# Patient Record
Sex: Female | Born: 1980
Health system: Southern US, Community
[De-identification: ages and names within clinical notes are randomized; demographics above are authoritative.]

## PROBLEM LIST (undated history)

## (undated) DIAGNOSIS — Z789 Other specified health status: Secondary | ICD-10-CM

## (undated) DIAGNOSIS — Z889 Allergy status to unspecified drugs, medicaments and biological substances status: Secondary | ICD-10-CM

## (undated) DIAGNOSIS — J189 Pneumonia, unspecified organism: Secondary | ICD-10-CM

## (undated) DIAGNOSIS — M5126 Other intervertebral disc displacement, lumbar region: Secondary | ICD-10-CM

## (undated) DIAGNOSIS — L509 Urticaria, unspecified: Secondary | ICD-10-CM

## (undated) DIAGNOSIS — F419 Anxiety disorder, unspecified: Secondary | ICD-10-CM

## (undated) DIAGNOSIS — G4730 Sleep apnea, unspecified: Secondary | ICD-10-CM

## (undated) DIAGNOSIS — Z973 Presence of spectacles and contact lenses: Secondary | ICD-10-CM

## (undated) HISTORY — PX: ABDOMINAL HYSTERECTOMY: SHX81

## (undated) HISTORY — DX: Urticaria, unspecified: L50.9

## (undated) HISTORY — PX: WISDOM TOOTH EXTRACTION: SHX21

## (undated) HISTORY — PX: BACK SURGERY: SHX140

## (undated) HISTORY — PX: TYMPANOSTOMY TUBE PLACEMENT: SHX32

---
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
## 2002-01-29 ENCOUNTER — Encounter: Admission: RE | Admit: 2002-01-29 | Discharge: 2002-01-29 | Payer: Self-pay | Admitting: *Deleted

## 2002-02-03 ENCOUNTER — Encounter: Admission: RE | Admit: 2002-02-03 | Discharge: 2002-02-03 | Payer: Self-pay | Admitting: Psychiatry

## 2002-02-23 ENCOUNTER — Encounter: Admission: RE | Admit: 2002-02-23 | Discharge: 2002-02-23 | Payer: Self-pay | Admitting: Psychiatry

## 2002-03-05 ENCOUNTER — Other Ambulatory Visit: Admission: RE | Admit: 2002-03-05 | Discharge: 2002-03-05 | Payer: Self-pay | Admitting: Obstetrics and Gynecology

## 2002-03-09 ENCOUNTER — Encounter: Admission: RE | Admit: 2002-03-09 | Discharge: 2002-03-09 | Payer: Self-pay | Admitting: Psychiatry

## 2002-03-23 ENCOUNTER — Encounter: Admission: RE | Admit: 2002-03-23 | Discharge: 2002-03-23 | Payer: Self-pay | Admitting: Psychiatry

## 2002-04-30 ENCOUNTER — Ambulatory Visit (HOSPITAL_COMMUNITY): Admission: RE | Admit: 2002-04-30 | Discharge: 2002-04-30 | Payer: Self-pay | Admitting: Obstetrics and Gynecology

## 2002-05-21 HISTORY — PX: LEEP: SHX91

## 2002-10-02 ENCOUNTER — Ambulatory Visit (HOSPITAL_COMMUNITY): Admission: RE | Admit: 2002-10-02 | Discharge: 2002-10-02 | Payer: Self-pay | Admitting: Obstetrics and Gynecology

## 2003-02-11 ENCOUNTER — Other Ambulatory Visit: Admission: RE | Admit: 2003-02-11 | Discharge: 2003-02-11 | Payer: Self-pay | Admitting: Obstetrics and Gynecology

## 2003-06-30 ENCOUNTER — Other Ambulatory Visit: Admission: RE | Admit: 2003-06-30 | Discharge: 2003-06-30 | Payer: Self-pay | Admitting: Obstetrics and Gynecology

## 2003-07-26 ENCOUNTER — Ambulatory Visit (HOSPITAL_COMMUNITY): Admission: RE | Admit: 2003-07-26 | Discharge: 2003-07-26 | Payer: Self-pay | Admitting: *Deleted

## 2010-05-21 NOTE — L&D Delivery Note (Signed)
Delivery Note At 10:41 PM a viable and healthy female was delivered via Vaginal, Spontaneous Delivery (Presentation:ROA ;  ).  APGAR: 9, 9; weight: Not yet available Placenta status: Intact, Spontaneous.  Cord: 3 vessels.  Pt delivered a vigorous female infant in the vertex OA presentation.  With delivery of the head a nuchal cord x 1 was noted.  The cord could not be reduced before the anterior shoulder delivered.  The cord was also noted to be wrapped around the shoulders and left arm.  The infant cried vigorously after delivery and was passed to the waiting maternal abdomen.  Cord was clamped and cut.  Cord blood was collected.  The placenta delivered spontaneously, intact, with 3VC.  A small left labial laceration was repaired with a 4-0 vicryl figure of 8 stitch.  EBL 250.  Mother and baby are doing well after delivery.  Anesthesia: Epidural  Episiotomy: None  Lacerations: Left labial laceration Suture Repair: vicryl 4-0 Est. Blood Loss (mL): 250 cc  Mom to postpartum.  Baby to nursery-stable.  Sherilee Smotherman H. 04/05/2011, 11:04 PM

## 2010-10-05 LAB — ANTIBODY SCREEN: Antibody Screen: NEGATIVE

## 2011-03-05 LAB — HIV ANTIBODY (ROUTINE TESTING W REFLEX): HIV: NONREACTIVE

## 2011-03-05 LAB — STREP B DNA PROBE: GBS: NEGATIVE

## 2011-04-05 ENCOUNTER — Encounter (HOSPITAL_COMMUNITY): Payer: Self-pay | Admitting: Anesthesiology

## 2011-04-05 ENCOUNTER — Inpatient Hospital Stay (HOSPITAL_COMMUNITY): Payer: BC Managed Care – PPO | Admitting: Anesthesiology

## 2011-04-05 ENCOUNTER — Inpatient Hospital Stay (HOSPITAL_COMMUNITY)
Admission: AD | Admit: 2011-04-05 | Discharge: 2011-04-07 | DRG: 373 | Disposition: A | Discharge status: Home / Self Care | Payer: BC Managed Care – PPO | Source: Ambulatory Visit | Attending: Obstetrics and Gynecology | Admitting: Obstetrics and Gynecology

## 2011-04-05 ENCOUNTER — Encounter (HOSPITAL_COMMUNITY): Payer: Self-pay | Admitting: *Deleted

## 2011-04-05 DIAGNOSIS — Z789 Other specified health status: Secondary | ICD-10-CM | POA: Insufficient documentation

## 2011-04-05 HISTORY — DX: Other specified health status: Z78.9

## 2011-04-05 LAB — URIC ACID: Uric Acid, Serum: 4 mg/dL (ref 2.4–7.0)

## 2011-04-05 LAB — COMPREHENSIVE METABOLIC PANEL
ALT: 8 U/L (ref 0–35)
Calcium: 9.3 mg/dL (ref 8.4–10.5)
Creatinine, Ser: 0.58 mg/dL (ref 0.50–1.10)
GFR calc Af Amer: >90 mL/min (ref >90)
Glucose, Bld: 79 mg/dL (ref 70–99)
Sodium: 137 mEq/L (ref 135–145)
Total Protein: 6 g/dL (ref 6.0–8.3)

## 2011-04-05 LAB — CBC
HCT: 36.7 % (ref 36.0–46.0)
MCHC: 33 g/dL (ref 30.0–36.0)
RDW: 13.8 % (ref 11.5–15.5)

## 2011-04-05 LAB — RPR: RPR Ser Ql: NONREACTIVE

## 2011-04-05 MED ORDER — TERBUTALINE SULFATE 1 MG/ML IJ SOLN: 0.2500 mg | Freq: Once | INTRAMUSCULAR | Status: AC | PRN

## 2011-04-05 MED ORDER — LACTATED RINGERS IV SOLN
500.0000 mL | INTRAVENOUS | Status: DC | PRN
  Administered 2011-04-05: 1000 mL via INTRAVENOUS

## 2011-04-05 MED ORDER — OXYTOCIN 20 UNITS IN LACTATED RINGERS INFUSION - SIMPLE
1.0000 m[IU]/min | INTRAVENOUS | Status: DC
  Administered 2011-04-05: 2 m[IU]/min via INTRAVENOUS
  Filled 2011-04-05: qty 1000

## 2011-04-05 MED ORDER — LACTATED RINGERS IV SOLN: 500.0000 mL | Freq: Once | INTRAVENOUS | Status: DC

## 2011-04-05 MED ORDER — BUTORPHANOL TARTRATE 2 MG/ML IJ SOLN: 1.0000 mg | INTRAMUSCULAR | Status: DC | PRN

## 2011-04-05 MED ORDER — FLEET ENEMA 7-19 GM/118ML RE ENEM: 1.0000 | ENEMA | RECTAL | Status: DC | PRN

## 2011-04-05 MED ORDER — CITRIC ACID-SODIUM CITRATE 334-500 MG/5ML PO SOLN: 30.0000 mL | ORAL | Status: DC | PRN

## 2011-04-05 MED ORDER — OXYCODONE-ACETAMINOPHEN 5-325 MG PO TABS: 2.0000 | ORAL_TABLET | ORAL | Status: DC | PRN

## 2011-04-05 MED ORDER — OXYTOCIN BOLUS FROM INFUSION
500.0000 mL | Freq: Once | INTRAVENOUS | Status: DC
  Filled 2011-04-05: qty 1000
  Filled 2011-04-05: qty 500

## 2011-04-05 MED ORDER — LACTATED RINGERS IV SOLN
INTRAVENOUS | Status: DC
  Administered 2011-04-05: 70 mL/h via INTRAVENOUS
  Administered 2011-04-05: 125 mL/h via INTRAVENOUS

## 2011-04-05 MED ORDER — SODIUM BICARBONATE 8.4 % IV SOLN
INTRAVENOUS | Status: DC | PRN
  Administered 2011-04-05: 5 mL via EPIDURAL

## 2011-04-05 MED ORDER — PHENYLEPHRINE 40 MCG/ML (10ML) SYRINGE FOR IV PUSH (FOR BLOOD PRESSURE SUPPORT)
80.0000 ug | PREFILLED_SYRINGE | INTRAVENOUS | Status: DC | PRN
  Filled 2011-04-05: qty 5

## 2011-04-05 MED ORDER — FENTANYL 2.5 MCG/ML BUPIVACAINE 1/10 % EPIDURAL INFUSION (WH - ANES)
INTRAMUSCULAR | Status: DC | PRN
  Administered 2011-04-05: 14 mL/h via EPIDURAL

## 2011-04-05 MED ORDER — LIDOCAINE HCL (PF) 1 % IJ SOLN
30.0000 mL | INTRAMUSCULAR | Status: DC | PRN
  Filled 2011-04-05: qty 30

## 2011-04-05 MED ORDER — EPHEDRINE 5 MG/ML INJ
10.0000 mg | INTRAVENOUS | Status: DC | PRN
  Filled 2011-04-05: qty 4

## 2011-04-05 MED ORDER — EPHEDRINE 5 MG/ML INJ: 10.0000 mg | INTRAVENOUS | Status: DC | PRN

## 2011-04-05 MED ORDER — ONDANSETRON HCL 4 MG/2ML IJ SOLN: 4.0000 mg | Freq: Four times a day (QID) | INTRAMUSCULAR | Status: DC | PRN

## 2011-04-05 MED ORDER — ACETAMINOPHEN 325 MG PO TABS: 650.0000 mg | ORAL_TABLET | ORAL | Status: DC | PRN

## 2011-04-05 MED ORDER — DIPHENHYDRAMINE HCL 50 MG/ML IJ SOLN
12.5000 mg | INTRAMUSCULAR | Status: AC | PRN
  Administered 2011-04-05 (×3): 12.5 mg via INTRAVENOUS
  Filled 2011-04-05 (×2): qty 1

## 2011-04-05 MED ORDER — PHENYLEPHRINE 40 MCG/ML (10ML) SYRINGE FOR IV PUSH (FOR BLOOD PRESSURE SUPPORT): 80.0000 ug | PREFILLED_SYRINGE | INTRAVENOUS | Status: DC | PRN

## 2011-04-05 MED ORDER — ZOLPIDEM TARTRATE 10 MG PO TABS: 10.0000 mg | ORAL_TABLET | Freq: Every evening | ORAL | Status: DC | PRN

## 2011-04-05 MED ORDER — FENTANYL 2.5 MCG/ML BUPIVACAINE 1/10 % EPIDURAL INFUSION (WH - ANES)
14.0000 mL/h | INTRAMUSCULAR | Status: DC
  Administered 2011-04-05 (×2): 14 mL/h via EPIDURAL
  Filled 2011-04-05 (×3): qty 60

## 2011-04-05 MED ORDER — IBUPROFEN 600 MG PO TABS: 600.0000 mg | ORAL_TABLET | Freq: Four times a day (QID) | ORAL | Status: DC | PRN

## 2011-04-05 MED ORDER — OXYTOCIN 20 UNITS IN LACTATED RINGERS INFUSION - SIMPLE
125.0000 mL/h | Freq: Once | INTRAVENOUS | Status: AC
  Administered 2011-04-05: 999 mL/h via INTRAVENOUS

## 2011-04-05 NOTE — Progress Notes (Signed)
  Labor Progress Note  S: Feeling uncomfortable with contractions.  Requesting epidural before AROM O: Filed Vitals:   04/05/11 1102 04/05/11 1132 04/05/11 1200 04/05/11 1232  BP: 116/67 116/50  123/69  Pulse: 83 63  68  Temp:   98 F (36.7 C)   TempSrc:   Oral   Resp: 18 18 20    Height:      Weight:       AOX3, uncomfortable appearing FHT 150 + accels, no decels, reactive Cervix deferred until after epidural placement toco q 3-5 irreg  A/P 1) FWB reassuring 2) Place epidural 3) AROM after epidural

## 2011-04-05 NOTE — Anesthesia Procedure Notes (Signed)

## 2011-04-05 NOTE — Progress Notes (Signed)
MD made aware of pt status: SVE, uterine contraction pattern, FHT. Will continue with current POC. Will continue to monitor.

## 2011-04-05 NOTE — H&P (Signed)
Norma Carlson is a 30 y.o. female [phone] @ 39+0 presenting for IOL Uncomplicated pregnancy to this point.  Pt transferred OB care at 13 weeks. She denies VB, LOF. Endorses active FM.  History OB History    Grav Para Term Preterm Abortions TAB SAB Ect Mult Living   4 1 1  2  2   1      Past Medical History  Diagnosis Date  . No pertinent past medical history    Past Surgical History  Procedure Date  . Leep 2004   Family History: Non-contributory Social History:  reports that she has never smoked. She does not have any smokeless tobacco history on file. She reports that she does not drink alcohol or use illicit drugs.  ROS  Dilation: 2 Effacement (%): 70 Station: -2 Exam by:: felkel,rn Blood pressure 133/67, pulse 76, temperature 98.5 F (36.9 C), temperature source Oral, resp. rate 18, height 5\' 8"  (1.727 m), weight 97.977 kg (216 lb). Exam Physical Exam  Prenatal labs: ABO, Rh: O/Positive/-- (05/17 0000) Antibody: Negative (05/17 0000) Rubella: Immune (05/15 0000) RPR: Nonreactive (10/15 0000)  HBsAg:   Neg HIV: Non-reactive (10/15 0000)  GBS: Negative (10/15 0000)   Assessment/Plan: 30 yo G4P1021 at 39+0 for IOL 1) Admit 2) Pitocin 3) AROM when able 4) Epidural on request  Norma Carlson H. 04/05/2011, 9:47 AM

## 2011-04-05 NOTE — Progress Notes (Signed)
  Labor PN  Comfortable after epidural .AFVSS Dilation: 3 Effacement (%): 70 Cervical Position: Anterior Station: -2 Presentation: Vertex Exam by:: dr. Tenny Craw fht 130-150 reactive with accels no decels toco q 2-4  AROM clear fluid  A/p Cont pit fwb reassuring

## 2011-04-05 NOTE — Progress Notes (Signed)
  Labor Progress Note  S: Comfortable with epidural O:  Filed Vitals:   04/05/11 1602 04/05/11 1632 04/05/11 1702 04/05/11 1713  BP: 127/66 128/59 130/57   Pulse: 71 76 77   Temp: 98.1 F (36.7 C)     TempSrc: Oral     Resp: 18 18 18 20   Height:      Weight:      AOx3 Cvx 4/780/-2 Toco: irregularly tracing FHT 130-150 reactive + accels, no decels  IUPC placed  A/P 1) Continue Pit 2) FWB reassuring

## 2011-04-05 NOTE — Progress Notes (Signed)
Pt delivered viable female SVD by DR Tenny Craw with APGARS 9,9.

## 2011-04-05 NOTE — Anesthesia Preprocedure Evaluation (Signed)

## 2011-04-06 MED ORDER — IBUPROFEN 600 MG PO TABS
600.0000 mg | ORAL_TABLET | Freq: Four times a day (QID) | ORAL | Status: DC
  Administered 2011-04-06 – 2011-04-07 (×7): 600 mg via ORAL
  Filled 2011-04-06 (×7): qty 1

## 2011-04-06 MED ORDER — SENNOSIDES-DOCUSATE SODIUM 8.6-50 MG PO TABS
2.0000 | ORAL_TABLET | Freq: Every day | ORAL | Status: DC
  Administered 2011-04-06 – 2011-04-07 (×2): 2 via ORAL

## 2011-04-06 MED ORDER — TETANUS-DIPHTH-ACELL PERTUSSIS 5-2.5-18.5 LF-MCG/0.5 IM SUSP
0.5000 mL | Freq: Once | INTRAMUSCULAR | Status: AC
  Administered 2011-04-07: 0.5 mL via INTRAMUSCULAR
  Filled 2011-04-06: qty 0.5

## 2011-04-06 MED ORDER — METHYLERGONOVINE MALEATE 0.2 MG/ML IJ SOLN: 0.2000 mg | INTRAMUSCULAR | Status: DC | PRN

## 2011-04-06 MED ORDER — DIPHENHYDRAMINE HCL 25 MG PO CAPS: 25.0000 mg | ORAL_CAPSULE | Freq: Four times a day (QID) | ORAL | Status: DC | PRN

## 2011-04-06 MED ORDER — METHYLERGONOVINE MALEATE 0.2 MG PO TABS: 0.2000 mg | ORAL_TABLET | ORAL | Status: DC | PRN

## 2011-04-06 MED ORDER — OXYCODONE-ACETAMINOPHEN 5-325 MG PO TABS
1.0000 | ORAL_TABLET | ORAL | Status: DC | PRN
  Administered 2011-04-06: 2 via ORAL
  Administered 2011-04-06 (×2): 1 via ORAL
  Administered 2011-04-06: 2 via ORAL
  Administered 2011-04-06 – 2011-04-07 (×2): 1 via ORAL
  Administered 2011-04-07: 2 via ORAL
  Filled 2011-04-06 (×2): qty 1
  Filled 2011-04-06 (×3): qty 2
  Filled 2011-04-06: qty 1
  Filled 2011-04-06: qty 2

## 2011-04-06 MED ORDER — PRENATAL PLUS 27-1 MG PO TABS
1.0000 | ORAL_TABLET | Freq: Every day | ORAL | Status: DC
  Administered 2011-04-06 – 2011-04-07 (×2): 1 via ORAL
  Filled 2011-04-06 (×2): qty 1

## 2011-04-06 MED ORDER — SIMETHICONE 80 MG PO CHEW: 80.0000 mg | CHEWABLE_TABLET | ORAL | Status: DC | PRN

## 2011-04-06 MED ORDER — DIBUCAINE 1 % RE OINT: 1.0000 "application " | TOPICAL_OINTMENT | RECTAL | Status: DC | PRN

## 2011-04-06 MED ORDER — ONDANSETRON HCL 4 MG PO TABS: 4.0000 mg | ORAL_TABLET | ORAL | Status: DC | PRN

## 2011-04-06 MED ORDER — ONDANSETRON HCL 4 MG/2ML IJ SOLN: 4.0000 mg | INTRAMUSCULAR | Status: DC | PRN

## 2011-04-06 MED ORDER — ZOLPIDEM TARTRATE 5 MG PO TABS: 5.0000 mg | ORAL_TABLET | Freq: Every evening | ORAL | Status: DC | PRN

## 2011-04-06 MED ORDER — LANOLIN HYDROUS EX OINT: TOPICAL_OINTMENT | CUTANEOUS | Status: DC | PRN

## 2011-04-06 MED ORDER — WITCH HAZEL-GLYCERIN EX PADS: 1.0000 "application " | MEDICATED_PAD | CUTANEOUS | Status: DC | PRN

## 2011-04-06 MED ORDER — BENZOCAINE-MENTHOL 20-0.5 % EX AERO: 1.0000 "application " | INHALATION_SPRAY | CUTANEOUS | Status: DC | PRN

## 2011-04-06 NOTE — Progress Notes (Signed)
Post Partum Day 1 Subjective: no complaints  Objective: Blood pressure 113/73, pulse 76, temperature 97.6 F (36.4 C), temperature source Oral, resp. rate 20, height 5\' 8"  (1.727 m), weight 97.977 kg (216 lb), SpO2 97.00%, unknown if currently breastfeeding.  Physical Exam:  General: alert Lochia: appropriate Uterine Fundus: firm   Basename 04/05/11 0805  HGB 12.1  HCT 36.7    Assessment/Plan: Plan for discharge tomorrow, infant circumcision at parent's request.   LOS: 1 day   Abhimanyu Cruces D 04/06/2011, 8:44 AM

## 2011-04-06 NOTE — Anesthesia Postprocedure Evaluation (Signed)
  Anesthesia Post-op Note  Patient: Norma Carlson  Procedure(s) Performed: * No procedures listed *  Patient Location: Mother/Baby  Anesthesia Type: Epidural  Level of Consciousness: awake, alert  and oriented  Airway and Oxygen Therapy: Patient Spontanous Breathing  Post-op Assessment: Patient's Cardiovascular Status Stable and Respiratory Function Stable  Post-op Vital Signs: Reviewed and stable  Complications: No apparent anesthesia complications

## 2011-04-06 NOTE — Progress Notes (Signed)
Pt to room 112 in stable condition. Report given to RN

## 2011-04-07 MED ORDER — INFLUENZA VIRUS VACC SPLIT PF IM SUSP
0.5000 mL | INTRAMUSCULAR | Status: AC | PRN
  Administered 2011-04-07: 0.5 mL via INTRAMUSCULAR
  Filled 2011-04-07: qty 0.5

## 2011-04-07 NOTE — Discharge Summary (Signed)
Obstetric Discharge Summary Reason for Admission: induction of labor Prenatal Procedures: ultrasound Intrapartum Procedures: spontaneous vaginal delivery Postpartum Procedures: none Complications-Operative and Postpartum: small left labial laceration - repaired Hemoglobin  Date Value Range Status  04/05/2011 12.1  12.0-15.0 (g/dL) Final     HCT  Date Value Range Status  04/05/2011 36.7  36.0-46.0 (%) Final    Discharge Diagnoses: Term Pregnancy-delivered  Discharge Information: Date: 04/07/2011 Activity: pelvic rest Diet: routine Medications: PNV and Ibuprofen Condition: stable Instructions: refer to practice specific booklet Discharge to: home Follow-up Information    Follow up with ROSS,KENDRA H. in 5 weeks.   Contact information:   [address] [place] [address] [phone]          Newborn Data: Live born female  Birth Weight: 7 lb 12 oz (3515 g) APGAR: 9, 9  Home with mother.  Emaad Nanna D 04/07/2011, 8:50 AM

## 2011-04-07 NOTE — Progress Notes (Signed)
Post Partum Day 2 Subjective: no complaints  Objective: Blood pressure 119/68, pulse 106, temperature 98.2 F (36.8 C), temperature source Oral, resp. rate 18, height 5\' 8"  (1.727 m), weight 97.977 kg (216 lb), SpO2 97.00%.  Not breast feeding.  Physical Exam:  General: alert Lochia: appropriate Uterine Fundus: firm   Basename 04/05/11 0805  HGB 12.1  HCT 36.7    Assessment/Plan: Discharge home   LOS: 2 days   Norma Carlson D 04/07/2011, 8:46 AM

## 2011-04-12 ENCOUNTER — Inpatient Hospital Stay (HOSPITAL_COMMUNITY): Admission: AD | Admit: 2011-04-12 | Payer: Self-pay | Source: Ambulatory Visit | Admitting: Obstetrics & Gynecology

## 2014-03-22 ENCOUNTER — Encounter (HOSPITAL_COMMUNITY): Payer: Self-pay | Admitting: *Deleted

## 2015-12-23 DIAGNOSIS — J0101 Acute recurrent maxillary sinusitis: Secondary | ICD-10-CM | POA: Diagnosis not present

## 2015-12-23 DIAGNOSIS — Z6834 Body mass index (BMI) 34.0-34.9, adult: Secondary | ICD-10-CM | POA: Diagnosis not present

## 2015-12-23 DIAGNOSIS — H6501 Acute serous otitis media, right ear: Secondary | ICD-10-CM | POA: Diagnosis not present

## 2016-05-18 DIAGNOSIS — Z6835 Body mass index (BMI) 35.0-35.9, adult: Secondary | ICD-10-CM | POA: Diagnosis not present

## 2016-05-18 DIAGNOSIS — J209 Acute bronchitis, unspecified: Secondary | ICD-10-CM | POA: Diagnosis not present

## 2016-05-18 DIAGNOSIS — J019 Acute sinusitis, unspecified: Secondary | ICD-10-CM | POA: Diagnosis not present

## 2016-07-04 ENCOUNTER — Other Ambulatory Visit: Payer: Self-pay | Admitting: Obstetrics and Gynecology

## 2016-07-04 DIAGNOSIS — H20022 Recurrent acute iridocyclitis, left eye: Secondary | ICD-10-CM | POA: Diagnosis not present

## 2016-07-04 DIAGNOSIS — Z124 Encounter for screening for malignant neoplasm of cervix: Secondary | ICD-10-CM | POA: Diagnosis not present

## 2016-07-04 DIAGNOSIS — Z01419 Encounter for gynecological examination (general) (routine) without abnormal findings: Secondary | ICD-10-CM | POA: Diagnosis not present

## 2016-07-04 DIAGNOSIS — Z6834 Body mass index (BMI) 34.0-34.9, adult: Secondary | ICD-10-CM | POA: Diagnosis not present

## 2016-07-05 LAB — CYTOLOGY - PAP

## 2016-08-28 DIAGNOSIS — Z30433 Encounter for removal and reinsertion of intrauterine contraceptive device: Secondary | ICD-10-CM | POA: Diagnosis not present

## 2016-08-28 DIAGNOSIS — Z6834 Body mass index (BMI) 34.0-34.9, adult: Secondary | ICD-10-CM | POA: Diagnosis not present

## 2016-10-09 DIAGNOSIS — Z30431 Encounter for routine checking of intrauterine contraceptive device: Secondary | ICD-10-CM | POA: Diagnosis not present

## 2016-10-09 DIAGNOSIS — Z6834 Body mass index (BMI) 34.0-34.9, adult: Secondary | ICD-10-CM | POA: Diagnosis not present

## 2016-11-12 DIAGNOSIS — Z6834 Body mass index (BMI) 34.0-34.9, adult: Secondary | ICD-10-CM | POA: Diagnosis not present

## 2016-11-12 DIAGNOSIS — J019 Acute sinusitis, unspecified: Secondary | ICD-10-CM | POA: Diagnosis not present

## 2016-12-24 DIAGNOSIS — H6502 Acute serous otitis media, left ear: Secondary | ICD-10-CM | POA: Diagnosis not present

## 2016-12-24 DIAGNOSIS — Z6835 Body mass index (BMI) 35.0-35.9, adult: Secondary | ICD-10-CM | POA: Diagnosis not present

## 2017-01-01 DIAGNOSIS — J019 Acute sinusitis, unspecified: Secondary | ICD-10-CM | POA: Diagnosis not present

## 2017-01-01 DIAGNOSIS — F419 Anxiety disorder, unspecified: Secondary | ICD-10-CM | POA: Diagnosis not present

## 2017-01-01 DIAGNOSIS — Z6835 Body mass index (BMI) 35.0-35.9, adult: Secondary | ICD-10-CM | POA: Diagnosis not present

## 2017-01-31 DIAGNOSIS — J019 Acute sinusitis, unspecified: Secondary | ICD-10-CM | POA: Diagnosis not present

## 2017-01-31 DIAGNOSIS — J209 Acute bronchitis, unspecified: Secondary | ICD-10-CM | POA: Diagnosis not present

## 2017-01-31 DIAGNOSIS — Z6835 Body mass index (BMI) 35.0-35.9, adult: Secondary | ICD-10-CM | POA: Diagnosis not present

## 2017-04-04 DIAGNOSIS — Z23 Encounter for immunization: Secondary | ICD-10-CM | POA: Diagnosis not present

## 2017-05-06 DIAGNOSIS — J209 Acute bronchitis, unspecified: Secondary | ICD-10-CM | POA: Diagnosis not present

## 2017-05-06 DIAGNOSIS — Z6836 Body mass index (BMI) 36.0-36.9, adult: Secondary | ICD-10-CM | POA: Diagnosis not present

## 2017-09-03 DIAGNOSIS — F419 Anxiety disorder, unspecified: Secondary | ICD-10-CM | POA: Diagnosis not present

## 2017-09-03 DIAGNOSIS — Z6836 Body mass index (BMI) 36.0-36.9, adult: Secondary | ICD-10-CM | POA: Diagnosis not present

## 2017-09-03 DIAGNOSIS — L259 Unspecified contact dermatitis, unspecified cause: Secondary | ICD-10-CM | POA: Diagnosis not present

## 2018-02-13 DIAGNOSIS — Z6841 Body Mass Index (BMI) 40.0 and over, adult: Secondary | ICD-10-CM | POA: Diagnosis not present

## 2018-02-13 DIAGNOSIS — F419 Anxiety disorder, unspecified: Secondary | ICD-10-CM | POA: Diagnosis not present

## 2018-03-06 DIAGNOSIS — Z23 Encounter for immunization: Secondary | ICD-10-CM | POA: Diagnosis not present

## 2018-03-14 DIAGNOSIS — J019 Acute sinusitis, unspecified: Secondary | ICD-10-CM | POA: Diagnosis not present

## 2018-03-14 DIAGNOSIS — Z6835 Body mass index (BMI) 35.0-35.9, adult: Secondary | ICD-10-CM | POA: Diagnosis not present

## 2018-03-14 DIAGNOSIS — B070 Plantar wart: Secondary | ICD-10-CM | POA: Diagnosis not present

## 2018-04-11 DIAGNOSIS — Z30431 Encounter for routine checking of intrauterine contraceptive device: Secondary | ICD-10-CM | POA: Diagnosis not present

## 2018-04-14 DIAGNOSIS — G4733 Obstructive sleep apnea (adult) (pediatric): Secondary | ICD-10-CM | POA: Diagnosis not present

## 2018-04-15 DIAGNOSIS — G4733 Obstructive sleep apnea (adult) (pediatric): Secondary | ICD-10-CM | POA: Diagnosis not present

## 2018-04-15 DIAGNOSIS — R5383 Other fatigue: Secondary | ICD-10-CM | POA: Diagnosis not present

## 2018-04-21 ENCOUNTER — Telehealth: Payer: Self-pay | Admitting: Obstetrics and Gynecology

## 2018-04-21 NOTE — Telephone Encounter (Signed)
Called and left a message for patient to call back to schedule a new patient doctor referral appointment with our office to see Dr. Edward JollySilva for: rectocele.

## 2018-04-22 NOTE — Telephone Encounter (Signed)
[facility] left voicemail after hours, [facility]

## 2018-04-23 DIAGNOSIS — M79672 Pain in left foot: Secondary | ICD-10-CM | POA: Diagnosis not present

## 2018-04-23 DIAGNOSIS — B078 Other viral warts: Secondary | ICD-10-CM | POA: Diagnosis not present

## 2018-04-23 NOTE — Telephone Encounter (Signed)
Patient returning call.

## 2018-04-23 NOTE — Telephone Encounter (Signed)
[facility]

## 2018-04-23 NOTE — Telephone Encounter (Signed)
Called and left a message for patient to call back to schedule a new patient doctor referral appointment with our office to see Dr. Silva for: rectocele. °

## 2018-04-24 ENCOUNTER — Encounter: Payer: Self-pay | Admitting: Obstetrics and Gynecology

## 2018-05-06 DIAGNOSIS — R5383 Other fatigue: Secondary | ICD-10-CM | POA: Diagnosis not present

## 2018-05-06 DIAGNOSIS — G4733 Obstructive sleep apnea (adult) (pediatric): Secondary | ICD-10-CM | POA: Diagnosis not present

## 2018-05-22 DIAGNOSIS — M79672 Pain in left foot: Secondary | ICD-10-CM | POA: Diagnosis not present

## 2018-05-22 DIAGNOSIS — B078 Other viral warts: Secondary | ICD-10-CM | POA: Diagnosis not present

## 2018-05-29 ENCOUNTER — Ambulatory Visit: Payer: BLUE CROSS/BLUE SHIELD | Admitting: Obstetrics and Gynecology

## 2018-06-24 DIAGNOSIS — J101 Influenza due to other identified influenza virus with other respiratory manifestations: Secondary | ICD-10-CM | POA: Diagnosis not present

## 2018-06-24 DIAGNOSIS — R509 Fever, unspecified: Secondary | ICD-10-CM | POA: Diagnosis not present

## 2018-06-26 DIAGNOSIS — J209 Acute bronchitis, unspecified: Secondary | ICD-10-CM | POA: Diagnosis not present

## 2018-06-26 DIAGNOSIS — R509 Fever, unspecified: Secondary | ICD-10-CM | POA: Diagnosis not present

## 2018-06-26 DIAGNOSIS — J101 Influenza due to other identified influenza virus with other respiratory manifestations: Secondary | ICD-10-CM | POA: Diagnosis not present

## 2018-07-10 DIAGNOSIS — G4733 Obstructive sleep apnea (adult) (pediatric): Secondary | ICD-10-CM | POA: Diagnosis not present

## 2018-07-23 DIAGNOSIS — N816 Rectocele: Secondary | ICD-10-CM | POA: Diagnosis not present

## 2018-07-23 DIAGNOSIS — K5902 Outlet dysfunction constipation: Secondary | ICD-10-CM | POA: Diagnosis not present

## 2018-07-23 DIAGNOSIS — N812 Incomplete uterovaginal prolapse: Secondary | ICD-10-CM | POA: Diagnosis not present

## 2018-07-23 DIAGNOSIS — N819 Female genital prolapse, unspecified: Secondary | ICD-10-CM | POA: Diagnosis not present

## 2018-08-08 DIAGNOSIS — G4733 Obstructive sleep apnea (adult) (pediatric): Secondary | ICD-10-CM | POA: Diagnosis not present

## 2018-09-08 DIAGNOSIS — G4733 Obstructive sleep apnea (adult) (pediatric): Secondary | ICD-10-CM | POA: Diagnosis not present

## 2018-10-08 DIAGNOSIS — G4733 Obstructive sleep apnea (adult) (pediatric): Secondary | ICD-10-CM | POA: Diagnosis not present

## 2018-11-08 DIAGNOSIS — G4733 Obstructive sleep apnea (adult) (pediatric): Secondary | ICD-10-CM | POA: Diagnosis not present

## 2018-11-24 DIAGNOSIS — Z1159 Encounter for screening for other viral diseases: Secondary | ICD-10-CM | POA: Diagnosis not present

## 2018-11-24 DIAGNOSIS — Z01812 Encounter for preprocedural laboratory examination: Secondary | ICD-10-CM | POA: Diagnosis not present

## 2018-11-24 DIAGNOSIS — N819 Female genital prolapse, unspecified: Secondary | ICD-10-CM | POA: Diagnosis not present

## 2018-11-25 DIAGNOSIS — N393 Stress incontinence (female) (male): Secondary | ICD-10-CM | POA: Diagnosis not present

## 2018-11-25 DIAGNOSIS — N812 Incomplete uterovaginal prolapse: Secondary | ICD-10-CM | POA: Diagnosis not present

## 2018-11-25 DIAGNOSIS — N816 Rectocele: Secondary | ICD-10-CM | POA: Diagnosis not present

## 2018-12-01 DIAGNOSIS — N393 Stress incontinence (female) (male): Secondary | ICD-10-CM | POA: Diagnosis not present

## 2018-12-01 DIAGNOSIS — K5902 Outlet dysfunction constipation: Secondary | ICD-10-CM | POA: Diagnosis not present

## 2018-12-01 DIAGNOSIS — M6208 Separation of muscle (nontraumatic), other site: Secondary | ICD-10-CM | POA: Diagnosis not present

## 2018-12-01 DIAGNOSIS — N816 Rectocele: Secondary | ICD-10-CM | POA: Diagnosis not present

## 2018-12-01 DIAGNOSIS — Z9079 Acquired absence of other genital organ(s): Secondary | ICD-10-CM | POA: Insufficient documentation

## 2018-12-01 DIAGNOSIS — N812 Incomplete uterovaginal prolapse: Secondary | ICD-10-CM | POA: Diagnosis not present

## 2018-12-01 DIAGNOSIS — N858 Other specified noninflammatory disorders of uterus: Secondary | ICD-10-CM | POA: Diagnosis not present

## 2018-12-02 DIAGNOSIS — N816 Rectocele: Secondary | ICD-10-CM | POA: Diagnosis not present

## 2018-12-02 DIAGNOSIS — N393 Stress incontinence (female) (male): Secondary | ICD-10-CM | POA: Diagnosis not present

## 2018-12-02 DIAGNOSIS — N812 Incomplete uterovaginal prolapse: Secondary | ICD-10-CM | POA: Diagnosis not present

## 2018-12-05 DIAGNOSIS — Z8742 Personal history of other diseases of the female genital tract: Secondary | ICD-10-CM | POA: Diagnosis not present

## 2018-12-05 DIAGNOSIS — S3023XA Contusion of vagina and vulva, initial encounter: Secondary | ICD-10-CM | POA: Diagnosis not present

## 2018-12-05 DIAGNOSIS — N812 Incomplete uterovaginal prolapse: Secondary | ICD-10-CM | POA: Diagnosis not present

## 2018-12-08 DIAGNOSIS — G4733 Obstructive sleep apnea (adult) (pediatric): Secondary | ICD-10-CM | POA: Diagnosis not present

## 2018-12-16 DIAGNOSIS — S3023XA Contusion of vagina and vulva, initial encounter: Secondary | ICD-10-CM | POA: Diagnosis not present

## 2018-12-16 DIAGNOSIS — N812 Incomplete uterovaginal prolapse: Secondary | ICD-10-CM | POA: Diagnosis not present

## 2019-01-05 DIAGNOSIS — S335XXA Sprain of ligaments of lumbar spine, initial encounter: Secondary | ICD-10-CM | POA: Diagnosis not present

## 2019-01-05 DIAGNOSIS — M5441 Lumbago with sciatica, right side: Secondary | ICD-10-CM | POA: Diagnosis not present

## 2019-01-06 DIAGNOSIS — M5441 Lumbago with sciatica, right side: Secondary | ICD-10-CM | POA: Diagnosis not present

## 2019-01-06 DIAGNOSIS — S335XXA Sprain of ligaments of lumbar spine, initial encounter: Secondary | ICD-10-CM | POA: Diagnosis not present

## 2019-01-07 DIAGNOSIS — S335XXA Sprain of ligaments of lumbar spine, initial encounter: Secondary | ICD-10-CM | POA: Diagnosis not present

## 2019-01-07 DIAGNOSIS — M5441 Lumbago with sciatica, right side: Secondary | ICD-10-CM | POA: Diagnosis not present

## 2019-01-08 DIAGNOSIS — G4733 Obstructive sleep apnea (adult) (pediatric): Secondary | ICD-10-CM | POA: Diagnosis not present

## 2019-01-08 DIAGNOSIS — S335XXA Sprain of ligaments of lumbar spine, initial encounter: Secondary | ICD-10-CM | POA: Diagnosis not present

## 2019-01-08 DIAGNOSIS — M5441 Lumbago with sciatica, right side: Secondary | ICD-10-CM | POA: Diagnosis not present

## 2019-01-12 DIAGNOSIS — M5441 Lumbago with sciatica, right side: Secondary | ICD-10-CM | POA: Diagnosis not present

## 2019-01-12 DIAGNOSIS — S335XXA Sprain of ligaments of lumbar spine, initial encounter: Secondary | ICD-10-CM | POA: Diagnosis not present

## 2019-01-13 DIAGNOSIS — N812 Incomplete uterovaginal prolapse: Secondary | ICD-10-CM | POA: Diagnosis not present

## 2019-01-13 DIAGNOSIS — R82998 Other abnormal findings in urine: Secondary | ICD-10-CM | POA: Diagnosis not present

## 2019-01-14 DIAGNOSIS — S335XXA Sprain of ligaments of lumbar spine, initial encounter: Secondary | ICD-10-CM | POA: Diagnosis not present

## 2019-01-14 DIAGNOSIS — M5441 Lumbago with sciatica, right side: Secondary | ICD-10-CM | POA: Diagnosis not present

## 2019-01-16 DIAGNOSIS — S335XXA Sprain of ligaments of lumbar spine, initial encounter: Secondary | ICD-10-CM | POA: Diagnosis not present

## 2019-01-16 DIAGNOSIS — M5441 Lumbago with sciatica, right side: Secondary | ICD-10-CM | POA: Diagnosis not present

## 2019-01-26 DIAGNOSIS — L739 Follicular disorder, unspecified: Secondary | ICD-10-CM | POA: Diagnosis not present

## 2019-02-04 DIAGNOSIS — S335XXA Sprain of ligaments of lumbar spine, initial encounter: Secondary | ICD-10-CM | POA: Diagnosis not present

## 2019-02-04 DIAGNOSIS — M5441 Lumbago with sciatica, right side: Secondary | ICD-10-CM | POA: Diagnosis not present

## 2019-02-05 DIAGNOSIS — S335XXA Sprain of ligaments of lumbar spine, initial encounter: Secondary | ICD-10-CM | POA: Diagnosis not present

## 2019-02-05 DIAGNOSIS — M5441 Lumbago with sciatica, right side: Secondary | ICD-10-CM | POA: Diagnosis not present

## 2019-02-06 DIAGNOSIS — N812 Incomplete uterovaginal prolapse: Secondary | ICD-10-CM | POA: Diagnosis not present

## 2019-02-06 DIAGNOSIS — R82998 Other abnormal findings in urine: Secondary | ICD-10-CM | POA: Diagnosis not present

## 2019-02-08 DIAGNOSIS — G4733 Obstructive sleep apnea (adult) (pediatric): Secondary | ICD-10-CM | POA: Diagnosis not present

## 2019-02-09 DIAGNOSIS — M5441 Lumbago with sciatica, right side: Secondary | ICD-10-CM | POA: Diagnosis not present

## 2019-02-09 DIAGNOSIS — S335XXA Sprain of ligaments of lumbar spine, initial encounter: Secondary | ICD-10-CM | POA: Diagnosis not present

## 2019-02-11 DIAGNOSIS — S335XXA Sprain of ligaments of lumbar spine, initial encounter: Secondary | ICD-10-CM | POA: Diagnosis not present

## 2019-02-11 DIAGNOSIS — M5441 Lumbago with sciatica, right side: Secondary | ICD-10-CM | POA: Diagnosis not present

## 2019-02-12 DIAGNOSIS — S335XXA Sprain of ligaments of lumbar spine, initial encounter: Secondary | ICD-10-CM | POA: Diagnosis not present

## 2019-02-12 DIAGNOSIS — M5441 Lumbago with sciatica, right side: Secondary | ICD-10-CM | POA: Diagnosis not present

## 2019-02-13 DIAGNOSIS — S335XXA Sprain of ligaments of lumbar spine, initial encounter: Secondary | ICD-10-CM | POA: Diagnosis not present

## 2019-02-13 DIAGNOSIS — M5441 Lumbago with sciatica, right side: Secondary | ICD-10-CM | POA: Diagnosis not present

## 2019-02-16 DIAGNOSIS — S335XXA Sprain of ligaments of lumbar spine, initial encounter: Secondary | ICD-10-CM | POA: Diagnosis not present

## 2019-02-16 DIAGNOSIS — M5441 Lumbago with sciatica, right side: Secondary | ICD-10-CM | POA: Diagnosis not present

## 2019-02-18 DIAGNOSIS — S335XXA Sprain of ligaments of lumbar spine, initial encounter: Secondary | ICD-10-CM | POA: Diagnosis not present

## 2019-02-18 DIAGNOSIS — M5441 Lumbago with sciatica, right side: Secondary | ICD-10-CM | POA: Diagnosis not present

## 2019-02-19 DIAGNOSIS — M5441 Lumbago with sciatica, right side: Secondary | ICD-10-CM | POA: Diagnosis not present

## 2019-02-19 DIAGNOSIS — S335XXA Sprain of ligaments of lumbar spine, initial encounter: Secondary | ICD-10-CM | POA: Diagnosis not present

## 2019-02-20 DIAGNOSIS — Z5181 Encounter for therapeutic drug level monitoring: Secondary | ICD-10-CM | POA: Diagnosis not present

## 2019-02-20 DIAGNOSIS — M5417 Radiculopathy, lumbosacral region: Secondary | ICD-10-CM | POA: Diagnosis not present

## 2019-02-20 DIAGNOSIS — Z79899 Other long term (current) drug therapy: Secondary | ICD-10-CM | POA: Diagnosis not present

## 2019-02-20 DIAGNOSIS — N812 Incomplete uterovaginal prolapse: Secondary | ICD-10-CM | POA: Diagnosis not present

## 2019-02-20 DIAGNOSIS — R82998 Other abnormal findings in urine: Secondary | ICD-10-CM | POA: Diagnosis not present

## 2019-02-20 DIAGNOSIS — R3129 Other microscopic hematuria: Secondary | ICD-10-CM | POA: Diagnosis not present

## 2019-02-25 DIAGNOSIS — Z23 Encounter for immunization: Secondary | ICD-10-CM | POA: Diagnosis not present

## 2019-03-10 DIAGNOSIS — G4733 Obstructive sleep apnea (adult) (pediatric): Secondary | ICD-10-CM | POA: Diagnosis not present

## 2019-03-12 DIAGNOSIS — M5417 Radiculopathy, lumbosacral region: Secondary | ICD-10-CM | POA: Diagnosis not present

## 2019-03-18 DIAGNOSIS — M5417 Radiculopathy, lumbosacral region: Secondary | ICD-10-CM | POA: Diagnosis not present

## 2019-03-18 DIAGNOSIS — Z79899 Other long term (current) drug therapy: Secondary | ICD-10-CM | POA: Diagnosis not present

## 2019-03-18 DIAGNOSIS — Z5181 Encounter for therapeutic drug level monitoring: Secondary | ICD-10-CM | POA: Diagnosis not present

## 2019-03-19 DIAGNOSIS — M5417 Radiculopathy, lumbosacral region: Secondary | ICD-10-CM | POA: Diagnosis not present

## 2019-03-19 DIAGNOSIS — M5126 Other intervertebral disc displacement, lumbar region: Secondary | ICD-10-CM | POA: Diagnosis not present

## 2019-03-19 DIAGNOSIS — M5116 Intervertebral disc disorders with radiculopathy, lumbar region: Secondary | ICD-10-CM | POA: Diagnosis not present

## 2019-03-19 DIAGNOSIS — M47816 Spondylosis without myelopathy or radiculopathy, lumbar region: Secondary | ICD-10-CM | POA: Diagnosis not present

## 2019-03-26 DIAGNOSIS — G894 Chronic pain syndrome: Secondary | ICD-10-CM | POA: Diagnosis not present

## 2019-03-26 DIAGNOSIS — M5417 Radiculopathy, lumbosacral region: Secondary | ICD-10-CM | POA: Diagnosis not present

## 2019-03-26 DIAGNOSIS — M792 Neuralgia and neuritis, unspecified: Secondary | ICD-10-CM | POA: Diagnosis not present

## 2019-03-26 DIAGNOSIS — M545 Low back pain: Secondary | ICD-10-CM | POA: Diagnosis not present

## 2019-03-27 DIAGNOSIS — R82998 Other abnormal findings in urine: Secondary | ICD-10-CM | POA: Diagnosis not present

## 2019-03-27 DIAGNOSIS — R3129 Other microscopic hematuria: Secondary | ICD-10-CM | POA: Diagnosis not present

## 2019-03-27 DIAGNOSIS — N812 Incomplete uterovaginal prolapse: Secondary | ICD-10-CM | POA: Diagnosis not present

## 2019-04-07 DIAGNOSIS — Z1272 Encounter for screening for malignant neoplasm of vagina: Secondary | ICD-10-CM | POA: Diagnosis not present

## 2019-04-07 DIAGNOSIS — Z803 Family history of malignant neoplasm of breast: Secondary | ICD-10-CM | POA: Diagnosis not present

## 2019-04-07 DIAGNOSIS — Z6835 Body mass index (BMI) 35.0-35.9, adult: Secondary | ICD-10-CM | POA: Diagnosis not present

## 2019-04-07 DIAGNOSIS — Z01419 Encounter for gynecological examination (general) (routine) without abnormal findings: Secondary | ICD-10-CM | POA: Diagnosis not present

## 2019-04-17 DIAGNOSIS — M545 Low back pain: Secondary | ICD-10-CM | POA: Diagnosis not present

## 2019-04-17 DIAGNOSIS — M5126 Other intervertebral disc displacement, lumbar region: Secondary | ICD-10-CM | POA: Diagnosis not present

## 2019-04-21 DIAGNOSIS — M5417 Radiculopathy, lumbosacral region: Secondary | ICD-10-CM | POA: Diagnosis not present

## 2019-04-21 DIAGNOSIS — Z1159 Encounter for screening for other viral diseases: Secondary | ICD-10-CM | POA: Diagnosis not present

## 2019-04-21 DIAGNOSIS — R030 Elevated blood-pressure reading, without diagnosis of hypertension: Secondary | ICD-10-CM | POA: Diagnosis not present

## 2019-04-21 DIAGNOSIS — M5127 Other intervertebral disc displacement, lumbosacral region: Secondary | ICD-10-CM | POA: Diagnosis not present

## 2019-04-27 DIAGNOSIS — M5127 Other intervertebral disc displacement, lumbosacral region: Secondary | ICD-10-CM | POA: Diagnosis not present

## 2019-04-27 DIAGNOSIS — M5117 Intervertebral disc disorders with radiculopathy, lumbosacral region: Secondary | ICD-10-CM | POA: Diagnosis not present

## 2019-05-21 DIAGNOSIS — Z20828 Contact with and (suspected) exposure to other viral communicable diseases: Secondary | ICD-10-CM | POA: Diagnosis not present

## 2019-05-22 HISTORY — PX: BACK SURGERY: SHX140

## 2019-05-23 DIAGNOSIS — Z20822 Contact with and (suspected) exposure to covid-19: Secondary | ICD-10-CM | POA: Diagnosis not present

## 2019-08-18 DIAGNOSIS — M5417 Radiculopathy, lumbosacral region: Secondary | ICD-10-CM | POA: Diagnosis not present

## 2019-08-18 DIAGNOSIS — I10 Essential (primary) hypertension: Secondary | ICD-10-CM | POA: Insufficient documentation

## 2019-08-18 DIAGNOSIS — Z6836 Body mass index (BMI) 36.0-36.9, adult: Secondary | ICD-10-CM | POA: Diagnosis not present

## 2019-08-31 DIAGNOSIS — L259 Unspecified contact dermatitis, unspecified cause: Secondary | ICD-10-CM | POA: Diagnosis not present

## 2019-09-03 DIAGNOSIS — M5127 Other intervertebral disc displacement, lumbosacral region: Secondary | ICD-10-CM | POA: Diagnosis not present

## 2019-09-03 DIAGNOSIS — M5417 Radiculopathy, lumbosacral region: Secondary | ICD-10-CM | POA: Diagnosis not present

## 2019-09-07 DIAGNOSIS — Z6841 Body Mass Index (BMI) 40.0 and over, adult: Secondary | ICD-10-CM | POA: Diagnosis not present

## 2019-09-07 DIAGNOSIS — F419 Anxiety disorder, unspecified: Secondary | ICD-10-CM | POA: Diagnosis not present

## 2019-09-08 DIAGNOSIS — M5417 Radiculopathy, lumbosacral region: Secondary | ICD-10-CM | POA: Diagnosis not present

## 2019-09-08 DIAGNOSIS — M5126 Other intervertebral disc displacement, lumbar region: Secondary | ICD-10-CM | POA: Diagnosis not present

## 2019-09-18 DIAGNOSIS — M5127 Other intervertebral disc displacement, lumbosacral region: Secondary | ICD-10-CM | POA: Diagnosis not present

## 2019-09-18 DIAGNOSIS — M5417 Radiculopathy, lumbosacral region: Secondary | ICD-10-CM | POA: Diagnosis not present

## 2019-09-18 DIAGNOSIS — M5126 Other intervertebral disc displacement, lumbar region: Secondary | ICD-10-CM | POA: Diagnosis not present

## 2019-09-28 ENCOUNTER — Other Ambulatory Visit: Payer: Self-pay | Admitting: Neurosurgery

## 2019-10-01 ENCOUNTER — Other Ambulatory Visit: Payer: Self-pay | Admitting: Neurosurgery

## 2019-10-01 DIAGNOSIS — Z6841 Body Mass Index (BMI) 40.0 and over, adult: Secondary | ICD-10-CM | POA: Diagnosis not present

## 2019-10-01 DIAGNOSIS — F419 Anxiety disorder, unspecified: Secondary | ICD-10-CM | POA: Diagnosis not present

## 2019-10-05 ENCOUNTER — Other Ambulatory Visit (HOSPITAL_COMMUNITY): Payer: BC Managed Care – PPO

## 2019-10-05 ENCOUNTER — Other Ambulatory Visit (HOSPITAL_COMMUNITY)
Admission: RE | Admit: 2019-10-05 | Discharge: 2019-10-05 | Disposition: A | Discharge status: Home / Self Care | Payer: BC Managed Care – PPO | Source: Ambulatory Visit | Attending: Neurosurgery | Admitting: Neurosurgery

## 2019-10-05 DIAGNOSIS — Z981 Arthrodesis status: Secondary | ICD-10-CM | POA: Diagnosis not present

## 2019-10-05 DIAGNOSIS — M5116 Intervertebral disc disorders with radiculopathy, lumbar region: Secondary | ICD-10-CM | POA: Diagnosis not present

## 2019-10-05 DIAGNOSIS — Z90710 Acquired absence of both cervix and uterus: Secondary | ICD-10-CM | POA: Diagnosis not present

## 2019-10-05 DIAGNOSIS — Z888 Allergy status to other drugs, medicaments and biological substances status: Secondary | ICD-10-CM | POA: Diagnosis not present

## 2019-10-05 DIAGNOSIS — M4326 Fusion of spine, lumbar region: Secondary | ICD-10-CM | POA: Diagnosis not present

## 2019-10-05 DIAGNOSIS — M5126 Other intervertebral disc displacement, lumbar region: Secondary | ICD-10-CM | POA: Diagnosis not present

## 2019-10-05 DIAGNOSIS — J302 Other seasonal allergic rhinitis: Secondary | ICD-10-CM | POA: Diagnosis not present

## 2019-10-05 DIAGNOSIS — M4726 Other spondylosis with radiculopathy, lumbar region: Secondary | ICD-10-CM | POA: Diagnosis not present

## 2019-10-05 DIAGNOSIS — Z20822 Contact with and (suspected) exposure to covid-19: Secondary | ICD-10-CM | POA: Diagnosis not present

## 2019-10-05 DIAGNOSIS — M4727 Other spondylosis with radiculopathy, lumbosacral region: Secondary | ICD-10-CM | POA: Diagnosis not present

## 2019-10-05 DIAGNOSIS — F419 Anxiety disorder, unspecified: Secondary | ICD-10-CM | POA: Diagnosis not present

## 2019-10-05 DIAGNOSIS — Z79899 Other long term (current) drug therapy: Secondary | ICD-10-CM | POA: Diagnosis not present

## 2019-10-05 DIAGNOSIS — Z01812 Encounter for preprocedural laboratory examination: Secondary | ICD-10-CM | POA: Insufficient documentation

## 2019-10-05 DIAGNOSIS — M5117 Intervertebral disc disorders with radiculopathy, lumbosacral region: Secondary | ICD-10-CM | POA: Diagnosis not present

## 2019-10-06 ENCOUNTER — Encounter (HOSPITAL_COMMUNITY): Payer: Self-pay | Admitting: Neurosurgery

## 2019-10-06 ENCOUNTER — Other Ambulatory Visit: Payer: Self-pay

## 2019-10-06 LAB — SARS CORONAVIRUS 2 (TAT 6-24 HRS): SARS Coronavirus 2: NEGATIVE

## 2019-10-06 NOTE — Progress Notes (Signed)
Pt denies SOB, chest pain, and being under the care of a cardiologist. Pt stated that PCP is Dr. Wayland Denis. Pt denies having a stress test, echo and cardiac cath. Pt denies having an EKG and chest x ray. Pt denies recent  Labs . Pt made aware to stop taking Aspirin (unless otherwise advised by surgeon), vitamins, fish oil and herbal medications. Do not take any NSAIDs ie: Ibuprofen, Advil, Naproxen (Aleve), Motrin, BC and Goody Powder. Pt reminded to bring in CPAP mask. Pt reminded to quarantine. Pt verbalized understanding of all pre-op instructions.

## 2019-10-07 ENCOUNTER — Inpatient Hospital Stay (HOSPITAL_COMMUNITY)
Admission: RE | Admit: 2019-10-07 | Discharge: 2019-10-08 | DRG: 455 | Disposition: A | Discharge status: Home / Self Care | Payer: BC Managed Care – PPO | Attending: Neurosurgery | Admitting: Neurosurgery

## 2019-10-07 ENCOUNTER — Inpatient Hospital Stay (HOSPITAL_COMMUNITY): Payer: BC Managed Care – PPO

## 2019-10-07 ENCOUNTER — Other Ambulatory Visit: Payer: Self-pay

## 2019-10-07 ENCOUNTER — Encounter (HOSPITAL_COMMUNITY)
Admission: RE | Disposition: A | Discharge status: Home / Self Care | Payer: Self-pay | Source: Home / Self Care | Attending: Neurosurgery

## 2019-10-07 ENCOUNTER — Encounter (HOSPITAL_COMMUNITY): Payer: Self-pay | Admitting: Neurosurgery

## 2019-10-07 ENCOUNTER — Inpatient Hospital Stay (HOSPITAL_COMMUNITY): Payer: BC Managed Care – PPO | Admitting: Anesthesiology

## 2019-10-07 DIAGNOSIS — M4326 Fusion of spine, lumbar region: Secondary | ICD-10-CM | POA: Diagnosis not present

## 2019-10-07 DIAGNOSIS — F419 Anxiety disorder, unspecified: Secondary | ICD-10-CM | POA: Diagnosis present

## 2019-10-07 DIAGNOSIS — M4727 Other spondylosis with radiculopathy, lumbosacral region: Secondary | ICD-10-CM | POA: Diagnosis present

## 2019-10-07 DIAGNOSIS — Z20822 Contact with and (suspected) exposure to covid-19: Secondary | ICD-10-CM | POA: Diagnosis present

## 2019-10-07 DIAGNOSIS — M4726 Other spondylosis with radiculopathy, lumbar region: Secondary | ICD-10-CM | POA: Diagnosis present

## 2019-10-07 DIAGNOSIS — M5116 Intervertebral disc disorders with radiculopathy, lumbar region: Secondary | ICD-10-CM | POA: Diagnosis present

## 2019-10-07 DIAGNOSIS — M5117 Intervertebral disc disorders with radiculopathy, lumbosacral region: Principal | ICD-10-CM | POA: Diagnosis present

## 2019-10-07 DIAGNOSIS — Z79899 Other long term (current) drug therapy: Secondary | ICD-10-CM | POA: Diagnosis not present

## 2019-10-07 DIAGNOSIS — Z888 Allergy status to other drugs, medicaments and biological substances status: Secondary | ICD-10-CM

## 2019-10-07 DIAGNOSIS — M5126 Other intervertebral disc displacement, lumbar region: Secondary | ICD-10-CM | POA: Diagnosis not present

## 2019-10-07 DIAGNOSIS — Z90710 Acquired absence of both cervix and uterus: Secondary | ICD-10-CM

## 2019-10-07 DIAGNOSIS — Z981 Arthrodesis status: Secondary | ICD-10-CM | POA: Diagnosis not present

## 2019-10-07 DIAGNOSIS — J302 Other seasonal allergic rhinitis: Secondary | ICD-10-CM | POA: Diagnosis present

## 2019-10-07 DIAGNOSIS — Z419 Encounter for procedure for purposes other than remedying health state, unspecified: Secondary | ICD-10-CM

## 2019-10-07 HISTORY — DX: Pneumonia, unspecified organism: J18.9

## 2019-10-07 HISTORY — DX: Allergy status to unspecified drugs, medicaments and biological substances: Z88.9

## 2019-10-07 HISTORY — DX: Presence of spectacles and contact lenses: Z97.3

## 2019-10-07 HISTORY — DX: Sleep apnea, unspecified: G47.30

## 2019-10-07 HISTORY — DX: Anxiety disorder, unspecified: F41.9

## 2019-10-07 HISTORY — DX: Other intervertebral disc displacement, lumbar region: M51.26

## 2019-10-07 LAB — CBC
HCT: 40.8 % (ref 36.0–46.0)
Hemoglobin: 13 g/dL (ref 12.0–15.0)
MCH: 30.2 pg (ref 26.0–34.0)
MCHC: 31.9 g/dL (ref 30.0–36.0)
MCV: 94.9 fL (ref 80.0–100.0)
Platelets: 292 10*3/uL (ref 150–400)
RBC: 4.3 MIL/uL (ref 3.87–5.11)
RDW: 13.6 % (ref 11.5–15.5)
WBC: 6 10*3/uL (ref 4.0–10.5)
nRBC: 0 % (ref 0.0–0.2)

## 2019-10-07 LAB — TYPE AND SCREEN
ABO/RH(D): O POS
Antibody Screen: NEGATIVE

## 2019-10-07 LAB — ABO/RH: ABO/RH(D): O POS

## 2019-10-07 LAB — SURGICAL PCR SCREEN
MRSA, PCR: NEGATIVE
Staphylococcus aureus: POSITIVE — AB

## 2019-10-07 SURGERY — POSTERIOR LUMBAR FUSION 1 LEVEL
Anesthesia: General | Site: Spine Lumbar

## 2019-10-07 MED ORDER — ROCURONIUM BROMIDE 10 MG/ML (PF) SYRINGE
PREFILLED_SYRINGE | INTRAVENOUS | Status: AC
Start: 2019-10-07 — End: ?
  Filled 2019-10-07: qty 10

## 2019-10-07 MED ORDER — THROMBIN 5000 UNITS EX SOLR
CUTANEOUS | Status: AC
  Filled 2019-10-07: qty 5000

## 2019-10-07 MED ORDER — BACITRACIN ZINC 500 UNIT/GM EX OINT
TOPICAL_OINTMENT | CUTANEOUS | Status: DC | PRN
  Administered 2019-10-07: 1 via TOPICAL

## 2019-10-07 MED ORDER — HYDROMORPHONE HCL 1 MG/ML IJ SOLN
INTRAMUSCULAR | Status: AC
  Filled 2019-10-07: qty 0.5

## 2019-10-07 MED ORDER — OXYCODONE HCL 5 MG PO TABS: 5.0000 mg | ORAL_TABLET | ORAL | Status: DC | PRN

## 2019-10-07 MED ORDER — BUPIVACAINE LIPOSOME 1.3 % IJ SUSP
INTRAMUSCULAR | Status: DC | PRN
  Administered 2019-10-07: 20 mL

## 2019-10-07 MED ORDER — THROMBIN 5000 UNITS EX SOLR
OROMUCOSAL | Status: DC | PRN
  Administered 2019-10-07 (×2): 5 mL via TOPICAL

## 2019-10-07 MED ORDER — ROCURONIUM BROMIDE 10 MG/ML (PF) SYRINGE
PREFILLED_SYRINGE | INTRAVENOUS | Status: DC | PRN
  Administered 2019-10-07: 20 mg via INTRAVENOUS
  Administered 2019-10-07: 60 mg via INTRAVENOUS
  Administered 2019-10-07: 20 mg via INTRAVENOUS

## 2019-10-07 MED ORDER — DEXAMETHASONE SODIUM PHOSPHATE 10 MG/ML IJ SOLN
INTRAMUSCULAR | Status: AC
  Filled 2019-10-07: qty 1

## 2019-10-07 MED ORDER — PHENOL 1.4 % MT LIQD: 1.0000 | OROMUCOSAL | Status: DC | PRN

## 2019-10-07 MED ORDER — SCOPOLAMINE 1 MG/3DAYS TD PT72
MEDICATED_PATCH | TRANSDERMAL | Status: AC
  Filled 2019-10-07: qty 1

## 2019-10-07 MED ORDER — FENTANYL CITRATE (PF) 250 MCG/5ML IJ SOLN
INTRAMUSCULAR | Status: AC
  Filled 2019-10-07: qty 5

## 2019-10-07 MED ORDER — ACETAMINOPHEN 325 MG PO TABS
650.0000 mg | ORAL_TABLET | ORAL | Status: DC | PRN
  Administered 2019-10-08 (×2): 650 mg via ORAL
  Filled 2019-10-07 (×2): qty 2

## 2019-10-07 MED ORDER — FENTANYL CITRATE (PF) 100 MCG/2ML IJ SOLN
25.0000 ug | INTRAMUSCULAR | Status: DC | PRN
  Administered 2019-10-07 (×3): 50 ug via INTRAVENOUS

## 2019-10-07 MED ORDER — CEFAZOLIN SODIUM-DEXTROSE 2-4 GM/100ML-% IV SOLN
2.0000 g | INTRAVENOUS | Status: AC
  Administered 2019-10-07: 2 g via INTRAVENOUS
  Filled 2019-10-07: qty 100

## 2019-10-07 MED ORDER — MIDAZOLAM HCL 2 MG/2ML IJ SOLN
INTRAMUSCULAR | Status: AC
  Filled 2019-10-07: qty 2

## 2019-10-07 MED ORDER — TRAMADOL HCL 50 MG PO TABS: 100.0000 mg | ORAL_TABLET | Freq: Four times a day (QID) | ORAL | Status: DC

## 2019-10-07 MED ORDER — 0.9 % SODIUM CHLORIDE (POUR BTL) OPTIME
TOPICAL | Status: DC | PRN
  Administered 2019-10-07: 1000 mL

## 2019-10-07 MED ORDER — PROPOFOL 10 MG/ML IV BOLUS
INTRAVENOUS | Status: DC | PRN
  Administered 2019-10-07: 160 mg via INTRAVENOUS

## 2019-10-07 MED ORDER — FENTANYL CITRATE (PF) 100 MCG/2ML IJ SOLN
INTRAMUSCULAR | Status: AC
  Filled 2019-10-07: qty 2

## 2019-10-07 MED ORDER — SODIUM CHLORIDE 0.9 % IV SOLN: 250.0000 mL | INTRAVENOUS | Status: DC

## 2019-10-07 MED ORDER — MAGNESIUM OXIDE 400 (241.3 MG) MG PO TABS
200.0000 mg | ORAL_TABLET | Freq: Every day | ORAL | Status: DC
  Administered 2019-10-07: 200 mg via ORAL
  Filled 2019-10-07: qty 1

## 2019-10-07 MED ORDER — ONDANSETRON HCL 4 MG/2ML IJ SOLN
INTRAMUSCULAR | Status: DC | PRN
  Administered 2019-10-07: 4 mg via INTRAVENOUS

## 2019-10-07 MED ORDER — SCOPOLAMINE 1 MG/3DAYS TD PT72
MEDICATED_PATCH | TRANSDERMAL | Status: DC | PRN
  Administered 2019-10-07: 1 via TRANSDERMAL

## 2019-10-07 MED ORDER — MENTHOL 3 MG MT LOZG: 1.0000 | LOZENGE | OROMUCOSAL | Status: DC | PRN

## 2019-10-07 MED ORDER — CEFAZOLIN SODIUM-DEXTROSE 2-4 GM/100ML-% IV SOLN
2.0000 g | Freq: Three times a day (TID) | INTRAVENOUS | Status: AC
  Administered 2019-10-07 (×2): 2 g via INTRAVENOUS
  Filled 2019-10-07 (×2): qty 100

## 2019-10-07 MED ORDER — LIDOCAINE 2% (20 MG/ML) 5 ML SYRINGE
INTRAMUSCULAR | Status: DC | PRN
  Administered 2019-10-07: 80 mg via INTRAVENOUS

## 2019-10-07 MED ORDER — DOCUSATE SODIUM 100 MG PO CAPS
100.0000 mg | ORAL_CAPSULE | Freq: Two times a day (BID) | ORAL | Status: DC
  Administered 2019-10-07 – 2019-10-08 (×3): 100 mg via ORAL
  Filled 2019-10-07 (×3): qty 1

## 2019-10-07 MED ORDER — SUGAMMADEX SODIUM 200 MG/2ML IV SOLN
INTRAVENOUS | Status: DC | PRN
  Administered 2019-10-07: 200 mg via INTRAVENOUS

## 2019-10-07 MED ORDER — ONDANSETRON HCL 4 MG/2ML IJ SOLN: 4.0000 mg | Freq: Four times a day (QID) | INTRAMUSCULAR | Status: DC | PRN

## 2019-10-07 MED ORDER — BUPIVACAINE-EPINEPHRINE 0.5% -1:200000 IJ SOLN
INTRAMUSCULAR | Status: AC
  Filled 2019-10-07: qty 1

## 2019-10-07 MED ORDER — ONDANSETRON HCL 4 MG/2ML IJ SOLN: 4.0000 mg | Freq: Once | INTRAMUSCULAR | Status: DC | PRN

## 2019-10-07 MED ORDER — ZOLPIDEM TARTRATE 5 MG PO TABS: 5.0000 mg | ORAL_TABLET | Freq: Every evening | ORAL | Status: DC | PRN

## 2019-10-07 MED ORDER — PHENYLEPHRINE HCL-NACL 10-0.9 MG/250ML-% IV SOLN
INTRAVENOUS | Status: DC | PRN
  Administered 2019-10-07: 20 ug/min via INTRAVENOUS

## 2019-10-07 MED ORDER — BACITRACIN ZINC 500 UNIT/GM EX OINT
TOPICAL_OINTMENT | CUTANEOUS | Status: AC
  Filled 2019-10-07: qty 28.35

## 2019-10-07 MED ORDER — BUPIVACAINE-EPINEPHRINE 0.5% -1:200000 IJ SOLN
INTRAMUSCULAR | Status: DC | PRN
  Administered 2019-10-07: 10 mL

## 2019-10-07 MED ORDER — DEXAMETHASONE SODIUM PHOSPHATE 10 MG/ML IJ SOLN
INTRAMUSCULAR | Status: DC | PRN
  Administered 2019-10-07: 4 mg via INTRAVENOUS

## 2019-10-07 MED ORDER — CHLORHEXIDINE GLUCONATE CLOTH 2 % EX PADS: 6.0000 | MEDICATED_PAD | Freq: Once | CUTANEOUS | Status: DC

## 2019-10-07 MED ORDER — GABAPENTIN 400 MG PO CAPS
800.0000 mg | ORAL_CAPSULE | Freq: Three times a day (TID) | ORAL | Status: DC
  Administered 2019-10-07 – 2019-10-08 (×3): 800 mg via ORAL
  Filled 2019-10-07 (×3): qty 2

## 2019-10-07 MED ORDER — OXYCODONE HCL 5 MG PO TABS
5.0000 mg | ORAL_TABLET | Freq: Once | ORAL | Status: AC | PRN
  Administered 2019-10-07: 5 mg via ORAL

## 2019-10-07 MED ORDER — OXYCODONE HCL 5 MG PO TABS
10.0000 mg | ORAL_TABLET | ORAL | Status: DC | PRN
  Administered 2019-10-07 – 2019-10-08 (×7): 10 mg via ORAL
  Filled 2019-10-07 (×7): qty 2

## 2019-10-07 MED ORDER — BUSPIRONE HCL 10 MG PO TABS
10.0000 mg | ORAL_TABLET | Freq: Two times a day (BID) | ORAL | Status: DC
  Administered 2019-10-07 – 2019-10-08 (×2): 10 mg via ORAL
  Filled 2019-10-07 (×3): qty 1

## 2019-10-07 MED ORDER — LACTATED RINGERS IV SOLN: INTRAVENOUS | Status: DC

## 2019-10-07 MED ORDER — LACTATED RINGERS IV SOLN: INTRAVENOUS | Status: DC | PRN

## 2019-10-07 MED ORDER — SODIUM CHLORIDE 0.9% FLUSH: 3.0000 mL | INTRAVENOUS | Status: DC | PRN

## 2019-10-07 MED ORDER — BISACODYL 10 MG RE SUPP: 10.0000 mg | Freq: Every day | RECTAL | Status: DC | PRN

## 2019-10-07 MED ORDER — HYDROMORPHONE HCL 1 MG/ML IJ SOLN
INTRAMUSCULAR | Status: DC | PRN
  Administered 2019-10-07 (×2): .5 mg via INTRAVENOUS

## 2019-10-07 MED ORDER — FENTANYL CITRATE (PF) 250 MCG/5ML IJ SOLN
INTRAMUSCULAR | Status: DC | PRN
  Administered 2019-10-07 (×5): 50 ug via INTRAVENOUS

## 2019-10-07 MED ORDER — CYCLOBENZAPRINE HCL 10 MG PO TABS
10.0000 mg | ORAL_TABLET | Freq: Three times a day (TID) | ORAL | Status: DC | PRN
  Administered 2019-10-07 – 2019-10-08 (×3): 10 mg via ORAL
  Filled 2019-10-07 (×3): qty 1

## 2019-10-07 MED ORDER — DULOXETINE HCL 60 MG PO CPEP
60.0000 mg | ORAL_CAPSULE | Freq: Every day | ORAL | Status: DC
  Administered 2019-10-08: 60 mg via ORAL
  Filled 2019-10-07: qty 2
  Filled 2019-10-07: qty 1

## 2019-10-07 MED ORDER — CYCLOBENZAPRINE HCL 10 MG PO TABS: 10.0000 mg | ORAL_TABLET | Freq: Every day | ORAL | Status: DC

## 2019-10-07 MED ORDER — SODIUM CHLORIDE 0.9% FLUSH
3.0000 mL | Freq: Two times a day (BID) | INTRAVENOUS | Status: DC
  Administered 2019-10-07 (×2): 3 mL via INTRAVENOUS

## 2019-10-07 MED ORDER — MIDAZOLAM HCL 5 MG/5ML IJ SOLN
INTRAMUSCULAR | Status: DC | PRN
  Administered 2019-10-07: 2 mg via INTRAVENOUS

## 2019-10-07 MED ORDER — SODIUM CHLORIDE 0.9 % IV SOLN
INTRAVENOUS | Status: DC | PRN
  Administered 2019-10-07: 500 mL

## 2019-10-07 MED ORDER — OXYCODONE HCL 5 MG PO TABS
ORAL_TABLET | ORAL | Status: AC
  Filled 2019-10-07: qty 1

## 2019-10-07 MED ORDER — ONDANSETRON HCL 4 MG PO TABS: 4.0000 mg | ORAL_TABLET | Freq: Four times a day (QID) | ORAL | Status: DC | PRN

## 2019-10-07 MED ORDER — ACETAMINOPHEN 500 MG PO TABS
1000.0000 mg | ORAL_TABLET | Freq: Four times a day (QID) | ORAL | Status: AC
  Administered 2019-10-07 (×3): 1000 mg via ORAL
  Filled 2019-10-07 (×3): qty 2

## 2019-10-07 MED ORDER — BUPIVACAINE LIPOSOME 1.3 % IJ SUSP
20.0000 mL | Freq: Once | INTRAMUSCULAR | Status: DC
  Filled 2019-10-07: qty 20

## 2019-10-07 MED ORDER — MORPHINE SULFATE (PF) 4 MG/ML IV SOLN
4.0000 mg | INTRAVENOUS | Status: DC | PRN
  Administered 2019-10-07: 4 mg via INTRAVENOUS
  Filled 2019-10-07: qty 1

## 2019-10-07 MED ORDER — LORATADINE 10 MG PO TABS
10.0000 mg | ORAL_TABLET | Freq: Every day | ORAL | Status: DC
  Administered 2019-10-08: 10 mg via ORAL
  Filled 2019-10-07: qty 1

## 2019-10-07 MED ORDER — OXYCODONE HCL 5 MG/5ML PO SOLN: 5.0000 mg | Freq: Once | ORAL | Status: AC | PRN

## 2019-10-07 MED ORDER — ACETAMINOPHEN 650 MG RE SUPP: 650.0000 mg | RECTAL | Status: DC | PRN

## 2019-10-07 MED ORDER — PROPOFOL 10 MG/ML IV BOLUS
INTRAVENOUS | Status: AC
  Filled 2019-10-07: qty 20

## 2019-10-07 MED ORDER — LIDOCAINE 2% (20 MG/ML) 5 ML SYRINGE
INTRAMUSCULAR | Status: AC
  Filled 2019-10-07: qty 5

## 2019-10-07 MED ORDER — PHENYLEPHRINE 40 MCG/ML (10ML) SYRINGE FOR IV PUSH (FOR BLOOD PRESSURE SUPPORT)
PREFILLED_SYRINGE | INTRAVENOUS | Status: DC | PRN
  Administered 2019-10-07: 80 ug via INTRAVENOUS

## 2019-10-07 SURGICAL SUPPLY — 71 items
BAG DECANTER FOR FLEXI CONT (MISCELLANEOUS) ×2 IMPLANT
BENZOIN TINCTURE PRP APPL 2/3 (GAUZE/BANDAGES/DRESSINGS) ×2 IMPLANT
BLADE CLIPPER SURG (BLADE) IMPLANT
BUR MATCHSTICK NEURO 3.0 LAGG (BURR) ×2 IMPLANT
BUR PRECISION FLUTE 6.0 (BURR) ×2 IMPLANT
CAGE ALTERA 10X31X9-13 15D (Cage) ×1 IMPLANT
CANISTER SUCT 3000ML PPV (MISCELLANEOUS) ×2 IMPLANT
CAP LOCK DLX THRD (Cap) ×5 IMPLANT
CARTRIDGE OIL MAESTRO DRILL (MISCELLANEOUS) ×1 IMPLANT
CNTNR URN SCR LID CUP LEK RST (MISCELLANEOUS) ×1 IMPLANT
CONT SPEC 4OZ STRL OR WHT (MISCELLANEOUS) ×1
COVER BACK TABLE 60X90IN (DRAPES) ×2 IMPLANT
COVER WAND RF STERILE (DRAPES) ×1 IMPLANT
DECANTER SPIKE VIAL GLASS SM (MISCELLANEOUS) ×2 IMPLANT
DIFFUSER DRILL AIR PNEUMATIC (MISCELLANEOUS) ×2 IMPLANT
DRAPE C-ARM 42X72 X-RAY (DRAPES) ×4 IMPLANT
DRAPE HALF SHEET 40X57 (DRAPES) ×2 IMPLANT
DRAPE LAPAROTOMY 100X72X124 (DRAPES) ×2 IMPLANT
DRAPE SURG 17X23 STRL (DRAPES) ×8 IMPLANT
DRSG OPSITE POSTOP 4X6 (GAUZE/BANDAGES/DRESSINGS) ×1 IMPLANT
ELECT BLADE 4.0 EZ CLEAN MEGAD (MISCELLANEOUS) ×2
ELECT REM PT RETURN 9FT ADLT (ELECTROSURGICAL) ×2
ELECTRODE BLDE 4.0 EZ CLN MEGD (MISCELLANEOUS) ×1 IMPLANT
ELECTRODE REM PT RTRN 9FT ADLT (ELECTROSURGICAL) ×1 IMPLANT
EVACUATOR 1/8 PVC DRAIN (DRAIN) IMPLANT
GAUZE 4X4 16PLY RFD (DISPOSABLE) ×2 IMPLANT
GAUZE SPONGE 4X4 12PLY STRL (GAUZE/BANDAGES/DRESSINGS) ×1 IMPLANT
GLOVE BIO SURGEON STRL SZ 6.5 (GLOVE) ×5 IMPLANT
GLOVE BIO SURGEON STRL SZ7 (GLOVE) ×2 IMPLANT
GLOVE BIO SURGEON STRL SZ8 (GLOVE) ×4 IMPLANT
GLOVE BIO SURGEON STRL SZ8.5 (GLOVE) ×4 IMPLANT
GLOVE BIOGEL PI IND STRL 6.5 (GLOVE) IMPLANT
GLOVE BIOGEL PI IND STRL 7.5 (GLOVE) IMPLANT
GLOVE BIOGEL PI INDICATOR 6.5 (GLOVE) ×3
GLOVE BIOGEL PI INDICATOR 7.5 (GLOVE) ×1
GLOVE ECLIPSE 7.5 STRL STRAW (GLOVE) ×1 IMPLANT
GLOVE EXAM NITRILE XL STR (GLOVE) IMPLANT
GOWN STRL REUS W/ TWL LRG LVL3 (GOWN DISPOSABLE) IMPLANT
GOWN STRL REUS W/ TWL XL LVL3 (GOWN DISPOSABLE) ×2 IMPLANT
GOWN STRL REUS W/TWL 2XL LVL3 (GOWN DISPOSABLE) IMPLANT
GOWN STRL REUS W/TWL LRG LVL3 (GOWN DISPOSABLE) ×3
GOWN STRL REUS W/TWL XL LVL3 (GOWN DISPOSABLE) ×6
HEMOSTAT POWDER KIT SURGIFOAM (HEMOSTASIS) ×3 IMPLANT
KIT BASIN OR (CUSTOM PROCEDURE TRAY) ×2 IMPLANT
KIT TURNOVER KIT B (KITS) ×2 IMPLANT
MILL MEDIUM DISP (BLADE) ×1 IMPLANT
NDL HYPO 21X1.5 SAFETY (NEEDLE) IMPLANT
NEEDLE HYPO 21X1.5 SAFETY (NEEDLE) ×2 IMPLANT
NEEDLE HYPO 22GX1.5 SAFETY (NEEDLE) ×2 IMPLANT
NS IRRIG 1000ML POUR BTL (IV SOLUTION) ×2 IMPLANT
OIL CARTRIDGE MAESTRO DRILL (MISCELLANEOUS) ×2
PACK LAMINECTOMY NEURO (CUSTOM PROCEDURE TRAY) ×2 IMPLANT
PAD ARMBOARD 7.5X6 YLW CONV (MISCELLANEOUS) ×8 IMPLANT
PATTIES SURGICAL .5 X1 (DISPOSABLE) IMPLANT
PATTIES SURGICAL 1X1 (DISPOSABLE) ×1 IMPLANT
PUTTY DBM 10CC CALC GRAN (Putty) ×1 IMPLANT
ROD CREO DLX CVD 6.35X40 (Rod) IMPLANT
ROD CURVED TI 6.35X40 (Rod) ×2 IMPLANT
SCREW PA DLX CREO 7.5X45 (Screw) ×4 IMPLANT
SPONGE LAP 4X18 RFD (DISPOSABLE) IMPLANT
SPONGE NEURO XRAY DETECT 1X3 (DISPOSABLE) IMPLANT
SPONGE SURGIFOAM ABS GEL 100 (HEMOSTASIS) IMPLANT
STRIP CLOSURE SKIN 1/2X4 (GAUZE/BANDAGES/DRESSINGS) ×2 IMPLANT
SUT VIC AB 1 CT1 18XBRD ANBCTR (SUTURE) ×2 IMPLANT
SUT VIC AB 1 CT1 8-18 (SUTURE) ×1
SUT VIC AB 2-0 CP2 18 (SUTURE) ×3 IMPLANT
SYR 20ML LL LF (SYRINGE) ×1 IMPLANT
TOWEL GREEN STERILE (TOWEL DISPOSABLE) ×2 IMPLANT
TOWEL GREEN STERILE FF (TOWEL DISPOSABLE) ×2 IMPLANT
TRAY FOLEY MTR SLVR 16FR STAT (SET/KITS/TRAYS/PACK) ×2 IMPLANT
WATER STERILE IRR 1000ML POUR (IV SOLUTION) ×2 IMPLANT

## 2019-10-07 NOTE — Transfer of Care (Signed)
Immediate Anesthesia Transfer of Care Note  Patient: Norma Carlson  Procedure(s) Performed: POSTERIOR LUMBAR INTERBODY FUSION, POSTERIOR INSTRUMENTATION LUMBAR FIVE- SACRAL ONE (N/A Spine Lumbar)  Patient Location: PACU  Anesthesia Type:General  Level of Consciousness: awake, alert  and oriented  Airway & Oxygen Therapy: Patient Spontanous Breathing and Patient connected to face mask oxygen  Post-op Assessment: Report given to RN, Post -op Vital signs reviewed and stable and Patient moving all extremities  Post vital signs: Reviewed and stable  Last Vitals:  Vitals Value Taken Time  BP 141/79 10/07/19 1213  Temp    Pulse 101 10/07/19 1214  Resp 27 10/07/19 1214  SpO2 99 % 10/07/19 1214  Vitals shown include unvalidated device data.  Last Pain:  Vitals:   10/07/19 0740  TempSrc:   PainSc: 7          Complications: No apparent anesthesia complications

## 2019-10-07 NOTE — Anesthesia Preprocedure Evaluation (Signed)
Anesthesia Evaluation  °Patient identified by MRN, date of birth, ID band °Patient awake ° ° ° °Reviewed: °Allergy & Precautions, NPO status , Patient's Chart, lab work & pertinent test results ° °Airway °Mallampati: III ° °TM Distance: >3 FB °Neck ROM: Full ° ° ° Dental ° °(+) Teeth Intact, Dental Advisory Given °  °Pulmonary ° °  °breath sounds clear to auscultation ° ° ° ° ° ° Cardiovascular ° °Rhythm:Regular Rate:Normal ° ° °  °Neuro/Psych °  ° GI/Hepatic °  °Endo/Other  ° ° Renal/GU °  ° °  °Musculoskeletal ° ° Abdominal °  °Peds ° Hematology °  °Anesthesia Other Findings ° ° Reproductive/Obstetrics ° °  ° ° ° ° ° ° ° ° ° ° ° ° ° °  °  ° ° ° ° ° ° ° ° °Anesthesia Physical °Anesthesia Plan ° °ASA: II ° °Anesthesia Plan: General  ° °Post-op Pain Management:   ° °Induction: Intravenous ° °PONV Risk Score and Plan: Ondansetron and Dexamethasone ° °Airway Management Planned: Oral ETT ° °Additional Equipment:  ° °Intra-op Plan:  ° °Post-operative Plan: Extubation in OR ° °Informed Consent: I have reviewed the patients History and Physical, chart, labs and discussed the procedure including the risks, benefits and alternatives for the proposed anesthesia with the patient or authorized representative who has indicated his/her understanding and acceptance.  ° ° ° °Dental advisory given ° °Plan Discussed with: CRNA and Anesthesiologist ° °Anesthesia Plan Comments:   ° ° ° ° ° ° °Anesthesia Quick Evaluation ° °

## 2019-10-07 NOTE — Anesthesia Procedure Notes (Signed)
Procedure Name: Intubation Date/Time: 10/07/2019 8:41 AM Performed by: Amadeo Garnet, CRNA Pre-anesthesia Checklist: Patient identified, Emergency Drugs available, Suction available and Patient being monitored Patient Re-evaluated:Patient Re-evaluated prior to induction Oxygen Delivery Method: Circle system utilized Preoxygenation: Pre-oxygenation with 100% oxygen Induction Type: IV induction Ventilation: Mask ventilation without difficulty Laryngoscope Size: Mac and 3 Grade View: Grade I Tube type: Oral Number of attempts: 1 Airway Equipment and Method: Stylet Placement Confirmation: positive ETCO2,  ETT inserted through vocal cords under direct vision and breath sounds checked- equal and bilateral Secured at: 21 cm Tube secured with: Tape Dental Injury: Teeth and Oropharynx as per pre-operative assessment

## 2019-10-07 NOTE — H&P (Signed)
Subjective: The patient is a 39 year old white female on whom I performed an L4-5 discectomy.  She did well initially but has developed recurrent back and bilateral leg pain consistent with a lumbar radiculopathy.  She has failed medical management and was worked up with a lumbar MRI which demonstrated a recurrent hernia disc at L5-S1.  I discussed the various treatment options with her.  She has decided to proceed with surgery.  Past Medical History:  Diagnosis Date  . Anxiety   . H/O seasonal allergies   . No pertinent past medical history   . Pneumonia   . Recurrent herniation of lumbar disc   . Sleep apnea    wears CPAP  . Wears glasses    and contacts    Past Surgical History:  Procedure Laterality Date  . ABDOMINAL HYSTERECTOMY    . LEEP  2004  . WISDOM TOOTH EXTRACTION      Allergies  Allergen Reactions  . Fluoxetine Nausea Only and Nausea And Vomiting    Social History   Tobacco Use  . Smoking status: Never Smoker  . Smokeless tobacco: Never Used  Substance Use Topics  . Alcohol use: Yes    Comment: social    History reviewed. No pertinent family history. Prior to Admission medications   Medication Sig Start Date End Date Taking? Authorizing Provider  acetaminophen (TYLENOL) 500 MG tablet Take 500 mg by mouth every 6 (six) hours as needed for mild pain. For pain    Yes [provider]  Ascorbic Acid (VITAMIN C) 1000 MG tablet Take 1,000 mg by mouth daily.   Yes [provider]  busPIRone (BUSPAR) 10 MG tablet Take 10 mg by mouth 2 (two) times daily. 09/07/19  Yes [provider]  cyclobenzaprine (FLEXERIL) 10 MG tablet Take 10 mg by mouth at bedtime.  08/18/19  Yes [provider]  DULoxetine (CYMBALTA) 30 MG capsule Take 60 mg by mouth daily. Pt takes 60 mg capsule in the morning 09/10/19  Yes [provider]  gabapentin (NEURONTIN) 800 MG tablet Take 800 mg by mouth 3 (three) times daily. 09/08/19  Yes [provider]  loratadine (CLARITIN) 10 MG tablet Take 10 mg by mouth daily.   Yes [provider]  Magnesium 250 MG TABS Take 250 mg by mouth at bedtime.   Yes [provider]  traMADol (ULTRAM) 50 MG tablet Take 100 mg by mouth 4 (four) times daily. 09/23/19  Yes [provider]     Review of Systems  Positive ROS: As above  All other systems have been reviewed and were otherwise negative with the exception of those mentioned in the HPI and as above.  Objective: Vital signs in last 24 hours: Temp:  [99 F (37.2 C)] 99 F (37.2 C) (05/19 0653) Pulse Rate:  [80] 80 (05/19 0653) Resp:  [18] 18 (05/19 0653) BP: (130)/(89) 130/89 (05/19 0653) SpO2:  [98 %] 98 % (05/19 0653) Weight:  [104.3 kg] 104.3 kg (05/19 0653) Estimated body mass index is 34.97 kg/m as calculated from the following:   Height as of this encounter: 5\' 8"  (1.727 m).   Weight as of this encounter: 104.3 kg.   General Appearance: Alert Head: Normocephalic, without obvious abnormality, atraumatic Eyes: PERRL, conjunctiva/corneas clear, EOM's intact,    Ears: Normal  Throat: Normal  Neck: Supple, Back: Her lumbar incision is well-healed. Lungs: Clear to auscultation bilaterally, respirations unlabored Heart: Regular rate and rhythm, no murmur, rub or gallop Abdomen: Soft,  non-tender Extremities: Extremities normal, atraumatic, no cyanosis or edema Skin: unremarkable  NEUROLOGIC:   Mental status: alert and oriented,Motor Exam - grossly normal Sensory Exam - grossly normal Reflexes:  Coordination - grossly normal Gait - grossly normal Balance - grossly normal Cranial Nerves: I: smell Not tested  II: visual acuity  OS: Normal  OD: Normal   II: visual fields Full to confrontation  II: pupils Equal, round, reactive to light  III,VII: ptosis None  III,IV,VI: extraocular muscles  Full ROM  V: mastication Normal  V: facial light touch sensation  Normal  V,VII: corneal reflex  Present  VII:  facial muscle function - upper  Normal  VII: facial muscle function - lower Normal  VIII: hearing Not tested  IX: soft palate elevation  Normal  IX,X: gag reflex Present  XI: trapezius strength  5/5  XI: sternocleidomastoid strength 5/5  XI: neck flexion strength  5/5  XII: tongue strength  Normal    Data Review Lab Results  Component Value Date   WBC 6.0 10/07/2019   HGB 13.0 10/07/2019   HCT 40.8 10/07/2019   MCV 94.9 10/07/2019   PLT 292 10/07/2019   Lab Results  Component Value Date   NA 137 04/05/2011   K 4.2 04/05/2011   CL 102 04/05/2011   CO2 25 04/05/2011   BUN 10 04/05/2011   CREATININE 0.58 04/05/2011   GLUCOSE 79 04/05/2011   No results found for: INR, PROTIME  Assessment/Plan: Recurrent herniated disc L5-S1, lumbosacral radiculopathy, lumbago: I have discussed the situation with the patient.  I have reviewed her imaging studies with her and pointed out the abnormalities.  We have discussed the various treatment options including surgery.  I have described the surgical treatment option of an L5-S1 decompression, instrumentation and fusion.  I have shown her surgical models.  I have given her a surgical pamphlet.  We have discussed the risks, benefits, alternatives, expected postoperative course, and likelihood of achieving our goals with surgery.  I have answered all her questions.  She has decided to proceed with surgery.   Cristi Loron 10/07/2019 8:19 AM

## 2019-10-07 NOTE — Progress Notes (Signed)
Subjective: The patient is alert and pleasant.  She is in no apparent distress.  She looks well.  Objective: Vital signs in last 24 hours: Temp:  [97.9 F (36.6 C)-99 F (37.2 C)] 97.9 F (36.6 C) (05/19 1213) Pulse Rate:  [80-116] 113 (05/19 1230) Resp:  [18-27] 21 (05/19 1230) BP: (130-143)/(68-89) 143/68 (05/19 1230) SpO2:  [88 %-99 %] 88 % (05/19 1230) Weight:  [104.3 kg] 104.3 kg (05/19 0653) Estimated body mass index is 34.97 kg/m as calculated from the following:   Height as of this encounter: 5\' 8"  (1.727 m).   Weight as of this encounter: 104.3 kg.   Intake/Output from previous day: No intake/output data recorded. Intake/Output this shift: Total I/O In: 1500 [I.V.:1500] Out: 650 [Urine:450; Blood:200]  Physical exam the patient is alert and pleasant.  She is moving her lower extremities well.  Lab Results: Recent Labs    10/07/19 0705  WBC 6.0  HGB 13.0  HCT 40.8  PLT 292   BMET No results for input(s): NA, K, CL, CO2, GLUCOSE, BUN, CREATININE, CALCIUM in the last 72 hours.  Studies/Results: DG Lumbar Spine 2-3 Views  Result Date: 10/07/2019 CLINICAL DATA:  Posterior lumbar interbody fusion EXAM: LUMBAR SPINE - 2-3 VIEW; DG C-ARM 1-60 MIN COMPARISON:  Lumbar MRI September 03, 2019; lateral lumbar spine from OR Oct 07, 2019 FLUOROSCOPY TIME:  0 minutes 18 seconds; 2 acquired images FINDINGS: Frontal and lateral views show pedicle screw fixation at L5 and S1 with pedicle screw tips in the respective vertebral bodies. Disc spacer is present at L5-S1. No fracture or spondylolisthesis evident in visualized lumbar spine. There remains mild disc space narrowing at L5-S1. IMPRESSION: Postoperative pedicle screw fixation at L5 and S1 with pedicle screw tips in respective vertebral bodies. Disc spacer at L5-S1. No fracture or spondylolisthesis evident and visualized lower lumbar regions. Electronically Signed   By: Oct 09, 2019 III M.D.   On: 10/07/2019 11:43   DG Lumbar  Spine 1 View  Result Date: 10/07/2019 CLINICAL DATA:  Intraoperative localization. EXAM: LUMBAR SPINE - 1 VIEW COMPARISON:  04/27/2019.  MRI of 09/03/2019. FINDINGS: Single lateral view labeled 9:39 a.m, image 1. Surgical vice is project posterior to the lumbosacral junction. Loss of intervertebral disc height at this level. IMPRESSION: Intraoperative localization of L5-S1. Electronically Signed   By: 09/05/2019 M.D.   On: 10/07/2019 11:03   DG C-Arm 1-60 Min  Result Date: 10/07/2019 CLINICAL DATA:  Posterior lumbar interbody fusion EXAM: LUMBAR SPINE - 2-3 VIEW; DG C-ARM 1-60 MIN COMPARISON:  Lumbar MRI September 03, 2019; lateral lumbar spine from OR Oct 07, 2019 FLUOROSCOPY TIME:  0 minutes 18 seconds; 2 acquired images FINDINGS: Frontal and lateral views show pedicle screw fixation at L5 and S1 with pedicle screw tips in the respective vertebral bodies. Disc spacer is present at L5-S1. No fracture or spondylolisthesis evident in visualized lumbar spine. There remains mild disc space narrowing at L5-S1. IMPRESSION: Postoperative pedicle screw fixation at L5 and S1 with pedicle screw tips in respective vertebral bodies. Disc spacer at L5-S1. No fracture or spondylolisthesis evident and visualized lower lumbar regions. Electronically Signed   By: Oct 09, 2019 III M.D.   On: 10/07/2019 11:43    Assessment/Plan: The patient is doing well.  I spoke with her husband.  LOS: 0 days     10/09/2019 10/07/2019, 12:39 PM

## 2019-10-07 NOTE — Op Note (Signed)
Brief history: The patient is a 39 year old white female on whom I performed an L5-S1 discectomy.  She did well initially but has developed recurrent back and bilateral leg pain.  She has failed medical management.  She was worked up with a lumbar MRI which demonstrated a recurrent herniated disc at L5-S1.  I discussed the various treatment options with her.  She has decided proceed with surgery after weighing the risks, benefits and alternatives.  Preoperative diagnosis: L5-S1 recurrent herniated disks, degenerative disc disease, lumbago; lumbar radiculopathy; neurogenic claudication  Postoperative diagnosis: The same  Procedure: Bilateral L5-S1 laminotomy/foraminotomies/medial facetectomy to decompress the bilateral L5 and S1 nerve roots(the work required to do this was in addition to the work required to do the posterior lumbar interbody fusion because of the patient's recurrent herniated disc, facet arthropathy. Etc. requiring a wide decompression of the nerve roots.);  L5-S1 transforaminal lumbar interbody fusion with local morselized autograft bone and Zimmer DBM; insertion of interbody prosthesis at L5-S1 (globus peek expandable interbody prosthesis); posterior nonsegmental instrumentation from L5 to S1 with globus titanium pedicle screws and rods; posterior lateral arthrodesis at L5-S1 with local morselized autograft bone and Zimmer DBM.  Surgeon: Dr. Delma Officer  Asst.: Dr. Jonita Albee and Hildred Priest, NP  Anesthesia: Gen. endotracheal  Estimated blood loss: 250 cc  Drains: None  Complications: None  Description of procedure: The patient was brought to the operating room by the anesthesia team. General endotracheal anesthesia was induced. The patient was turned to the prone position on the Wilson frame. The patient's lumbosacral region was then prepared with Betadine scrub and Betadine solution. Sterile drapes were applied.  I then injected the area to be incised with Marcaine with  epinephrine solution. I then used the scalpel to make a linear midline incision over the L5-S1 interspace, incising through the old surgical scar. I then used electrocautery to perform a bilateral subperiosteal dissection exposing the spinous process and lamina of L5 and S1 bilaterally. We then obtained intraoperative radiograph to confirm our location. We then inserted the Verstrac retractor to provide exposure.  I began the decompression by using the high speed drill to perform a right L5-S1 laminotomy and a redo left L5-S1 laminotomy. We then used the Kerrison punches to widen the laminotomy and removed the ligamentum flavum at on the right and the scar tissue on the left. We used the Kerrison punches to remove the medial facets at L5-S1. We performed wide foraminotomies about the bilateral L5 and S1 nerve roots completing the decompression.  We now turned our attention to the posterior lumbar interbody fusion. I used a scalpel to incise the intervertebral disc at L5-S1 bilaterally. I then performed a partial intervertebral discectomy at L5-S1 bilaterally using the pituitary forceps. We prepared the vertebral endplates at L5-S1 bilaterally for the fusion by removing the soft tissues with the curettes. We then used the trial spacers to pick the appropriate sized interbody prosthesis. We prefilled his prosthesis with a combination of local morselized autograft bone that we obtained during the decompression as well as Zimmer DBM. We inserted the prefilled prosthesis into the interspace at L5-S1 from the left, we then turned and expanded the prosthesis. There was a good snug fit of the prosthesis in the interspace. We then filled and the remainder of the intervertebral disc space with local morselized autograft bone and Zimmer DBM. This completed the posterior lumbar interbody arthrodesis.  During the decompression and insertion of the prosthesis the assistant protected the thecal sac and nerve roots  with the  D'Errico retractor.  We now turned attention to the instrumentation. Under fluoroscopic guidance we cannulated the bilateral L5 and S1 pedicles with the bone probe. We then removed the bone probe. We then tapped the pedicle with a 6.5 millimeter tap. We then removed the tap. We probed inside the tapped pedicle with a ball probe to rule out cortical breaches. We then inserted a 7.5 x 45 millimeter pedicle screw into the L5 and S1 pedicles bilaterally under fluoroscopic guidance. We then palpated along the medial aspect of the pedicles to rule out cortical breaches. There were none. The nerve roots were not injured. We then connected the unilateral pedicle screws with a lordotic rod. We compressed the construct and secured the rod in place with the caps. We then tightened the caps appropriately. This completed the instrumentation from L5-S1 bilaterally.  We now turned our attention to the posterior lateral arthrodesis at L5-S1 bilaterally. We used the high-speed drill to decorticate the remainder of the facets, pars, transverse process at L5-S1 bilaterally. We then applied a combination of local morselized autograft bone and Zimmer DBM over these decorticated posterior lateral structures. This completed the posterior lateral arthrodesis.  We then obtained hemostasis using bipolar electrocautery. We irrigated the wound out with bacitracin solution. We inspected the thecal sac and nerve roots and noted they were well decompressed. We then removed the retractor.  We injected Exparel . We reapproximated patient's thoracolumbar fascia with interrupted #1 Vicryl suture. We reapproximated patient's subcutaneous tissue with interrupted 2-0 Vicryl suture. The reapproximated patient's skin with Steri-Strips and benzoin. The wound was then coated with bacitracin ointment. A sterile dressing was applied. The drapes were removed. The patient was subsequently returned to the supine position where they were extubated by the  anesthesia team. He was then transported to the post anesthesia care unit in stable condition. All sponge instrument and needle counts were reportedly correct at the end of this case.

## 2019-10-07 NOTE — Anesthesia Postprocedure Evaluation (Signed)
Anesthesia Post Note  Patient: Norma Carlson  Procedure(s) Performed: POSTERIOR LUMBAR INTERBODY FUSION, POSTERIOR INSTRUMENTATION LUMBAR FIVE- SACRAL ONE (N/A Spine Lumbar)     Patient location during evaluation: PACU Anesthesia Type: General Level of consciousness: awake and alert Pain management: pain level controlled Vital Signs Assessment: post-procedure vital signs reviewed and stable Respiratory status: spontaneous breathing, nonlabored ventilation, respiratory function stable and patient connected to nasal cannula oxygen Cardiovascular status: blood pressure returned to baseline and stable Postop Assessment: no apparent nausea or vomiting Anesthetic complications: no    Last Vitals:  Vitals:   10/07/19 1345 10/07/19 1407  BP: 128/89 128/89  Pulse: (!) 108 (!) 102  Resp: 12   Temp:  36.6 C  SpO2: 95%     Last Pain:  Vitals:   10/07/19 1407  TempSrc:   PainSc: Asleep                 Melaya Hoselton COKER

## 2019-10-08 LAB — CBC
HCT: 36.3 % (ref 36.0–46.0)
Hemoglobin: 11.4 g/dL — ABNORMAL LOW (ref 12.0–15.0)
MCH: 30.2 pg (ref 26.0–34.0)
MCHC: 31.4 g/dL (ref 30.0–36.0)
MCV: 96.3 fL (ref 80.0–100.0)
Platelets: 258 10*3/uL (ref 150–400)
RBC: 3.77 MIL/uL — ABNORMAL LOW (ref 3.87–5.11)
RDW: 13.8 % (ref 11.5–15.5)
WBC: 10.8 10*3/uL — ABNORMAL HIGH (ref 4.0–10.5)
nRBC: 0 % (ref 0.0–0.2)

## 2019-10-08 LAB — BASIC METABOLIC PANEL
Anion gap: 8 (ref 5–15)
BUN: 7 mg/dL (ref 6–20)
CO2: 27 mmol/L (ref 22–32)
Calcium: 8.6 mg/dL — ABNORMAL LOW (ref 8.9–10.3)
Chloride: 105 mmol/L (ref 98–111)
Creatinine, Ser: 0.77 mg/dL (ref 0.44–1.00)
GFR calc Af Amer: >60 mL/min (ref >60)
GFR calc non Af Amer: >60 mL/min (ref >60)
Glucose, Bld: 110 mg/dL — ABNORMAL HIGH (ref 70–99)
Potassium: 4.1 mmol/L (ref 3.5–5.1)
Sodium: 140 mmol/L (ref 135–145)

## 2019-10-08 MED ORDER — ONDANSETRON HCL 4 MG PO TABS: 4.0000 mg | ORAL_TABLET | Freq: Four times a day (QID) | ORAL | 0 refills | Status: DC | PRN

## 2019-10-08 MED ORDER — OXYCODONE HCL 10 MG PO TABS: 10.0000 mg | ORAL_TABLET | ORAL | 0 refills | Status: DC | PRN

## 2019-10-08 MED ORDER — CYCLOBENZAPRINE HCL 10 MG PO TABS: 10.0000 mg | ORAL_TABLET | Freq: Three times a day (TID) | ORAL | 0 refills | Status: AC | PRN

## 2019-10-08 MED ORDER — DOCUSATE SODIUM 100 MG PO CAPS: 100.0000 mg | ORAL_CAPSULE | Freq: Two times a day (BID) | ORAL | 0 refills | Status: AC

## 2019-10-08 MED FILL — Heparin Sodium (Porcine) Inj 1000 Unit/ML: INTRAMUSCULAR | Qty: 30 | Status: AC

## 2019-10-08 MED FILL — Sodium Chloride IV Soln 0.9%: INTRAVENOUS | Qty: 1000 | Status: AC

## 2019-10-08 NOTE — Plan of Care (Signed)
Pt and husband given D/C instructions with verbal understanding. Rx's were sent to pharmacy by MD. Pt's incision is clean and dry with no sign of infection. Pt's IV was removed prior to D/C. Pt D/C'd home via wheelchair per MD order. Pt is stable @ D/C and has no other needs at this time. Ashley Allred, RN  

## 2019-10-08 NOTE — Evaluation (Signed)
Physical Therapy Evaluation Patient Details Name: Norma Carlson MRN: [id number] DOB: 22-Jul-1980 Today's Date: 10/08/2019   History of Present Illness  Pt is a 39 y.o female s/p PLIF L5-S1. PMH includes anxiety, microdiscectomy, PNA, hysterectomy, sleep apnea  Clinical Impression  Pt presented supine in bed with HOB elevated, awake and willing to participate in therapy session. Pt's spouse in room throughout session. Prior to admission, pt reported that she was independent with all functional mobility and ADLs. Pt lives with her spouse and 49 y/o son in a two level home with three steps to enter. At the time of evaluation, pt limited secondary to pain but still able to perform all functional mobility without physical assistance. Pt participated in stair training this session without difficulties. PT provided pt education re: back precautions, car transfers and a generalized walking program for pt to initiate upon d/c home. No further acute PT needs identified at this time. PT signing off.     Follow Up Recommendations No PT follow up;Other (comment)(potentially OPPT once cleared by MD if any issues arise)    Equipment Recommendations  None recommended by PT    Recommendations for Other Services       Precautions / Restrictions Precautions Precautions: Fall;Back Precaution Booklet Issued: Yes (comment) Precaution Comments: reviewed 3/3 back precautions with pt throughout Required Braces or Orthoses: Spinal Brace Spinal Brace: Lumbar corset Restrictions Weight Bearing Restrictions: No Other Position/Activity Restrictions: may remove to shower, walk to bathroom      Mobility  Bed Mobility Overal bed mobility: Needs Assistance Bed Mobility: Sidelying to Sit;Sit to Sidelying   Sidelying to sit: Supervision     Sit to sidelying: Supervision General bed mobility comments: cues for technique, +use of rails (will have hospital bed at home)  Transfers Overall transfer level: Needs  assistance Equipment used: None Transfers: Sit to/from Stand Sit to Stand: Supervision         General transfer comment: no instability or need for assistance  Ambulation/Gait Ambulation/Gait assistance: Min guard;Supervision Gait Distance (Feet): 200 Feet Assistive device: None Gait Pattern/deviations: Step-through pattern;Decreased step length - right;Decreased step length - left;Decreased stride length Gait velocity: decreased   General Gait Details: pt with slow, cautious and guarded gait, frequently reaching out for support surfaces and hand rails in hallway. Trialed RW; however, pt could not tolerate the squeaking noise  Stairs Stairs: Yes Stairs assistance: Min guard Stair Management: One rail Left;Step to pattern;Forwards Number of Stairs: 3 General stair comments: use of one hand rail and 1HHA (pt has no hand rails but will have husband and 2 y/o son on either side of her to assist)  Wheelchair Mobility    Modified Rankin (Stroke Patients Only)       Balance Overall balance assessment: Mild deficits observed, not formally tested                                           Pertinent Vitals/Pain Pain Assessment: Faces Pain Score: 6  Faces Pain Scale: Hurts even more Pain Location: incision Pain Descriptors / Indicators: Aching;Sore Pain Intervention(s): Monitored during session;Repositioned    Home Living Family/patient expects to be discharged to:: Private residence Living Arrangements: Spouse/significant other;Children;Parent(14 y/o son, mom is going to say while husband goes back to work) Available Help at Discharge: Family;Available 24 hours/day Type of Home: House Home Access: Stairs to enter   [employer] of  Steps: 3 Home Layout: Two level;Able to live on main level with bedroom/bathroom Home Equipment: Hospital bed Additional Comments: pt reports she rented a hospital bed to stay on the main floor    Prior Function  Level of Independence: Independent         Comments: works as a Chief Strategy Officer at the [employer]        Extremity/Trunk Assessment   Upper Extremity Assessment Upper Extremity Assessment: Defer to OT evaluation;Overall [facility] for tasks assessed    Lower Extremity Assessment Lower Extremity Assessment: Overall WFL for tasks assessed    Cervical / Trunk Assessment Cervical / Trunk Assessment: Other exceptions Cervical / Trunk Exceptions: s/p lumbar sx  Communication   Communication: No difficulties  Cognition Arousal/Alertness: Awake/alert Behavior During Therapy: WFL for tasks assessed/performed Overall Cognitive Status: Within Functional Limits for tasks assessed                                        General Comments      Exercises     Assessment/Plan    PT Assessment Patent does not need any further PT services  PT Problem List         PT Treatment Interventions      PT Goals (Current goals can be found in the Care Plan section)  Acute Rehab PT Goals Patient Stated Goal: return to independence PT Goal Formulation: All assessment and education complete, DC therapy    Frequency     Barriers to discharge        Co-evaluation               AM-PAC PT "6 Clicks" Mobility  Outcome Measure Help needed turning from your back to your side while in a flat bed without using bedrails?: None Help needed moving from lying on your back to sitting on the side of a flat bed without using bedrails?: None Help needed moving to and from a bed to a chair (including a wheelchair)?: None Help needed standing up from a chair using your arms (e.g., wheelchair or bedside chair)?: None Help needed to walk in hospital room?: None Help needed climbing 3-5 steps with a railing? : A Little 6 Click Score: 23    End of Session Equipment Utilized During Treatment: Back brace Activity Tolerance: Patient tolerated treatment well Patient left:  in bed;with call bell/phone within reach;with family/visitor present Nurse Communication: Mobility status PT Visit Diagnosis: Other abnormalities of gait and mobility (R26.89)    Time: 9758-8325 PT Time Calculation (min) (ACUTE ONLY): 23 min   Charges:   PT Evaluation $PT Eval Moderate Complexity: 1 Mod PT Treatments $Gait Training: 8-22 mins        Anastasio Champion, DPT  Acute Rehabilitation Services Pager [phone] Office [place] 10/08/2019, 11:17 AM

## 2019-10-08 NOTE — Evaluation (Signed)
Occupational Therapy Evaluation Patient Details Name: Norma Carlson MRN: [id number] DOB: 23-Jan-1981 Today's Date: 10/08/2019    History of Present Illness 39 y.o female s/p PLIF L5-S1. PMH includes anxiety, microdiscectomy, PNA, hysterectomy, sleep apnea   Clinical Impression   PTA pt living with spouse and son, independent for BADL/IADL. At time of evaluation, pt completing bed mobility at supervision level of assist and sit <> stands with min guard. Educated pt on use of AE as described below to increase independence- pt in understanding. Back handout provided and reviewed adls in detail. Pt educated on: clothing between brace, never sleep in brace, set an alarm at night for medication, avoid sitting for long periods of time, correct bed positioning for sleeping, correct sequence for bed mobility, avoiding lifting more than 5 pounds and never wash directly over incision. All education is complete and patient indicates understanding. No further OT needs identified, OT will sign off. Thank you for this consult.     Follow Up Recommendations  No OT follow up;Supervision - Intermittent    Equipment Recommendations  3 in 1 bedside commode    Recommendations for Other Services       Precautions / Restrictions Precautions Precautions: Fall;Back Precaution Booklet Issued: Yes (comment) Precaution Comments: reviewed in context of BADLs Required Braces or Orthoses: Spinal Brace Spinal Brace: Lumbar corset Restrictions Weight Bearing Restrictions: No Other Position/Activity Restrictions: may remove to shower, walk to bathroom      Mobility Bed Mobility Overal bed mobility: Needs Assistance Bed Mobility: Sidelying to Sit;Sit to Sidelying   Sidelying to sit: Supervision     Sit to sidelying: Supervision General bed mobility comments: cues for technique, +use of rails (will have hospital bed at home)  Transfers Overall transfer level: Needs assistance Equipment used:  None Transfers: Sit to/from Stand Sit to Stand: Min guard              Balance Overall balance assessment: Mild deficits observed, not formally tested(increased steadiness with external support)                                         ADL either performed or assessed with clinical judgement   ADL Overall ADL's : Needs assistance/impaired Eating/Feeding: Independent   Grooming: Independent;Sitting;Standing   Upper Body Bathing: Independent;Sitting;Standing   Lower Body Bathing: Set up;Sit to/from stand   Upper Body Dressing : Independent;Sitting   Lower Body Dressing: Minimal assistance;Sit to/from stand;Sitting/lateral leans Lower Body Dressing Details (indicate cue type and reason): difficulty obtaining figure 4 method. Educated pt to use reacher she has at home to don pants, as well as heel slides to improve ability to obtain figure 4 Toilet Transfer: Hydrographic surveyor Details (indicate cue type and reason): requires comfort height toilet seat, educated on BSC over toilet at home Toileting- Architect and Hygiene: Minimal assistance;Sitting/lateral lean;Adhering to back precautions;Cueing for back precautions Toileting - Clothing Manipulation Details (indicate cue type and reason): educated pt on toilet aide and how to acquire, pt looking into buying one during session. Difficulty maintaining precautions without AE Tub/ Shower Transfer: Min guard;Ambulation;3 in 1 Tub/Shower Transfer Details (indicate cue type and reason): educated on use of 3:1 as shower seat for safe bathing and increased ability to follow precautions and maintain independence Functional mobility during ADLs: Min guard       Vision Patient Visual Report: No change from baseline  Perception     Praxis      Pertinent Vitals/Pain Pain Assessment: 0-10 Pain Score: 6  Pain Location: incision Pain Descriptors / Indicators: Aching;Sore Pain Intervention(s):  Limited activity within patient's tolerance;Monitored during session;Repositioned     Hand Dominance     Extremity/Trunk Assessment Upper Extremity Assessment Upper Extremity Assessment: Overall WFL for tasks assessed   Lower Extremity Assessment Lower Extremity Assessment: Defer to PT evaluation       Communication Communication Communication: No difficulties   Cognition Arousal/Alertness: Awake/alert Behavior During Therapy: Anxious Overall Cognitive Status: Within Functional Limits for tasks assessed                                     General Comments       Exercises     Shoulder Instructions      Home Living Family/patient expects to be discharged to:: Private residence Living Arrangements: Spouse/significant other;Children;Parent(14 y/o son, mom is going to say while husband goes back to work) Available Help at Discharge: Family;Available 24 hours/day Type of Home: House Home Access: Stairs to enter [employer] of Steps: 3   Home Layout: Two level;Able to live on main level with bedroom/bathroom Alternate Level Stairs-Number of Steps: staying on first floor, otherwise full flight   Bathroom Shower/Tub: Teacher, early years/pre: Standard     Home Equipment: Hospital bed   Additional Comments: pt reports she rented a hospital bed to stay on the main floor      Prior Functioning/Environment Level of Independence: Independent                 OT Problem List: Decreased knowledge of use of DME or AE;Decreased knowledge of precautions;Decreased activity tolerance;Impaired balance (sitting and/or standing);Pain      OT Treatment/Interventions:      OT Goals(Current goals can be found in the care plan section) Acute Rehab OT Goals Patient Stated Goal: return to independence OT Goal Formulation: All assessment and education complete, DC therapy  OT Frequency:     Barriers to D/C:            Co-evaluation               AM-PAC OT "6 Clicks" Daily Activity     Outcome Measure Help from another person eating meals?: None Help from another person taking care of personal grooming?: None Help from another person toileting, which includes using toliet, bedpan, or urinal?: None Help from another person bathing (including washing, rinsing, drying)?: None Help from another person to put on and taking off regular upper body clothing?: None Help from another person to put on and taking off regular lower body clothing?: A Little 6 Click Score: 23   End of Session Equipment Utilized During Treatment: Gait belt;Rolling walker;Back brace Nurse Communication: Mobility status  Activity Tolerance: Patient tolerated treatment well Patient left: in bed;with call bell/phone within reach  OT Visit Diagnosis: Unsteadiness on feet (R26.81);Other abnormalities of gait and mobility (R26.89);Pain Pain - part of body: (back)                Time: 8299-3716 OT Time Calculation (min): 18 min Charges:  OT General Charges $OT Visit: 1 Visit OT Evaluation $OT Eval Low Complexity: 1 Low  Zenovia Jarred, MSOT, OTR/L Acute Rehabilitation Services [facility] Office Number: [phone] Pager: [phone]  Zenovia Jarred 10/08/2019, 10:04 AM

## 2019-10-08 NOTE — Discharge Summary (Signed)
Physician Discharge Summary  Patient ID: Norma Carlson MRN: [id number] DOB/AGE: 37-11-1980 37 y.o.  Admit date: 10/07/2019 Discharge date: 10/08/2019  Admission Diagnoses: recurrent L5-S1 disc herniation  Discharge Diagnoses:  Active Problems:   Recurrent displacement of lumbar disc   Discharged Condition: good  Hospital Course: This is a 39 yo F with hx of L5-S1 microdiscectomy who underwent L5-S1 TLIF for recurrent disc herniation.  She was admitted postoperatively to the spine unit.  On POD#1 her pain was well-controlled with PO pain medication and anti-spasmodics, and she was voiding and ambulating well.  She was deemed ready for discharge home.  Discharge Exam: Blood pressure (!) 113/52, pulse (!) 112, temperature 98.8 F (37.1 C), temperature source Oral, resp. rate 17, height 5\' 8"  (1.727 m), weight 104.3 kg, SpO2 94 %, unknown if currently breastfeeding. Awake, alert, NAD Breathing comfortably Dressing c/d/i 5/5 strength in HF, KE, DF, PF bilaterally.  Disposition:  home    Signed: 10/08/2019, 11:02 AM

## 2019-10-08 NOTE — Discharge Instructions (Signed)
Wound Care Keep incision covered and dry for two days.    Do not put any creams, lotions, or ointments on incision. Leave steri-strips on back.  They will fall off by themselves.  Activity Walk each and every day, increasing distance each day. No lifting greater than 5 lbs.  Avoid excessive neck motion. No driving for 2 weeks; may ride as a passenger locally.  Diet Resume your normal diet.   Return to Work Will be discussed at your follow up appointment.  Call Your Doctor If Any of These Occur Redness, drainage, or swelling at the wound.  Temperature greater than 101 degrees. Severe pain not relieved by pain medication. Incision starts to come apart.  Follow Up Appt Call today for appointment in 1-2 weeks ([phone]) or for problems.  If you have any hardware placed in your spine, you will need an x-ray before your appointment.  

## 2019-10-08 NOTE — Progress Notes (Signed)
Pt requested a Rx be called into her pharmacy for nausea. Megean B, NP was notified and she will send it to her pharmacy. Rema Fendt, RN

## 2019-10-27 DIAGNOSIS — M5126 Other intervertebral disc displacement, lumbar region: Secondary | ICD-10-CM | POA: Diagnosis not present

## 2019-12-08 DIAGNOSIS — Z8742 Personal history of other diseases of the female genital tract: Secondary | ICD-10-CM | POA: Diagnosis not present

## 2019-12-08 DIAGNOSIS — Z87448 Personal history of other diseases of urinary system: Secondary | ICD-10-CM | POA: Diagnosis not present

## 2019-12-18 DIAGNOSIS — M435X7 Other recurrent vertebral dislocation, lumbosacral region: Secondary | ICD-10-CM | POA: Insufficient documentation

## 2019-12-31 DIAGNOSIS — M5417 Radiculopathy, lumbosacral region: Secondary | ICD-10-CM | POA: Diagnosis not present

## 2019-12-31 DIAGNOSIS — M5126 Other intervertebral disc displacement, lumbar region: Secondary | ICD-10-CM | POA: Diagnosis not present

## 2019-12-31 DIAGNOSIS — M48061 Spinal stenosis, lumbar region without neurogenic claudication: Secondary | ICD-10-CM | POA: Diagnosis not present

## 2020-01-01 DIAGNOSIS — M5417 Radiculopathy, lumbosacral region: Secondary | ICD-10-CM | POA: Diagnosis not present

## 2020-02-02 DIAGNOSIS — Z981 Arthrodesis status: Secondary | ICD-10-CM | POA: Diagnosis not present

## 2020-02-02 DIAGNOSIS — R030 Elevated blood-pressure reading, without diagnosis of hypertension: Secondary | ICD-10-CM | POA: Diagnosis not present

## 2020-02-02 DIAGNOSIS — M5417 Radiculopathy, lumbosacral region: Secondary | ICD-10-CM | POA: Diagnosis not present

## 2020-02-02 DIAGNOSIS — Z6836 Body mass index (BMI) 36.0-36.9, adult: Secondary | ICD-10-CM | POA: Diagnosis not present

## 2020-02-12 DIAGNOSIS — M5417 Radiculopathy, lumbosacral region: Secondary | ICD-10-CM | POA: Diagnosis not present

## 2020-02-12 DIAGNOSIS — M5416 Radiculopathy, lumbar region: Secondary | ICD-10-CM | POA: Diagnosis not present

## 2020-02-24 DIAGNOSIS — Z981 Arthrodesis status: Secondary | ICD-10-CM | POA: Diagnosis not present

## 2020-02-24 DIAGNOSIS — M5417 Radiculopathy, lumbosacral region: Secondary | ICD-10-CM | POA: Diagnosis not present

## 2020-03-18 DIAGNOSIS — M5417 Radiculopathy, lumbosacral region: Secondary | ICD-10-CM | POA: Diagnosis not present

## 2020-03-18 DIAGNOSIS — M5416 Radiculopathy, lumbar region: Secondary | ICD-10-CM | POA: Diagnosis not present

## 2020-04-04 DIAGNOSIS — Z23 Encounter for immunization: Secondary | ICD-10-CM | POA: Diagnosis not present

## 2020-04-09 DIAGNOSIS — J0100 Acute maxillary sinusitis, unspecified: Secondary | ICD-10-CM | POA: Diagnosis not present

## 2020-04-12 DIAGNOSIS — F1120 Opioid dependence, uncomplicated: Secondary | ICD-10-CM | POA: Insufficient documentation

## 2020-04-12 DIAGNOSIS — Z981 Arthrodesis status: Secondary | ICD-10-CM | POA: Diagnosis not present

## 2020-04-12 DIAGNOSIS — M5417 Radiculopathy, lumbosacral region: Secondary | ICD-10-CM | POA: Diagnosis not present

## 2020-05-19 DIAGNOSIS — Z23 Encounter for immunization: Secondary | ICD-10-CM | POA: Diagnosis not present

## 2020-11-08 ENCOUNTER — Ambulatory Visit: Payer: BC Managed Care – PPO | Admitting: Allergy

## 2020-11-08 ENCOUNTER — Encounter: Payer: Self-pay | Admitting: Allergy

## 2020-11-08 ENCOUNTER — Other Ambulatory Visit: Payer: Self-pay

## 2020-11-08 VITALS — BP 132/80 | HR 97 | Temp 97.5°F | Resp 17 | Ht 67.5 in | Wt 240.5 lb

## 2020-11-08 DIAGNOSIS — L509 Urticaria, unspecified: Secondary | ICD-10-CM | POA: Diagnosis not present

## 2020-11-08 DIAGNOSIS — L508 Other urticaria: Secondary | ICD-10-CM | POA: Insufficient documentation

## 2020-11-08 MED ORDER — CETIRIZINE HCL 10 MG PO TABS: 20.0000 mg | ORAL_TABLET | Freq: Two times a day (BID) | ORAL | 3 refills | Status: DC

## 2020-11-08 NOTE — Assessment & Plan Note (Signed)
5-week history of whole body hives.  Tried Benadryl, famotidine, loratadine and prednisone with some benefit.  Denies any changes in diet, medications, personal care products or recent infections. Patient does take opioids for her chronic back pain.  Start zyrtec (cetirizine) 10mg  2 tablets twice a day.  If hives are not controlled or causes drowsiness let know.  Start Pepcid (famotidine) 20mg  twice a day.  . Avoid the following potential triggers: alcohol, tight clothing, NSAIDs, hot showers and getting overheated. . Read about Xolair injections for hives.  . Get bloodwork to rule out other etiologies.

## 2020-11-08 NOTE — Patient Instructions (Addendum)
Hives: Start zyrtec (cetirizine) 10mg  2 tablets twice a day. If hives are not controlled or causes drowsiness let know. Start pepcid (famotidine) 20mg  twice a day.  Avoid the following potential triggers: alcohol, tight clothing, NSAIDs, hot showers and getting overheated. Read about Xolair injections for hives.  Get bloodwork:  We are ordering labs, so please allow 1-2 weeks for the results to come back. With the newly implemented Cures Act, the labs might be visible to you at the same time that they become visible to me. However, I will not address the results until all of the results are back, so please be patient.   Follow up in 4 weeks in [place] with Dr. [place].

## 2020-11-08 NOTE — Progress Notes (Signed)
New Patient Note  RE: Norma Carlson MRN: [id number] DOB: May 08, 1981 Date of Office Visit: 11/08/2020  Consult requested by: Royann Shivers, * Primary care provider: Royann Shivers, PA-C  Chief Complaint: Urticaria  History of Present Illness: I had the pleasure of seeing Norma Carlson for initial evaluation at the [facility] on 11/08/2020. She is a 40 y.o. female, who is referred here by Royann Shivers, PA-C for the evaluation of urticaria.  Rash started about 5 weeks ago. This can occur anywhere on her body - initially it was only on her torso area. Describes them as itchy, red, raised, hot. Individual rashes lasts about 1 day. Some skin changes upon resolution. Associated symptoms include: none but last weekend has some trouble breathing when the hives appeared on her neck. Suspected triggers are unknown. Denies any fevers, chills, changes in medications, foods, personal care products or recent infections. She has tried the following therapies: benadryl, famotidine, loratadine with good benefit. Systemic steroids yes - 2 rounds. Currently on Pepcid 20mg  BID, loratadine 10mg  BID.  Previous work up includes: alpha gal bloodwork was negative per patient report. Apparently she had a tick bite a few months ago.  Previous history of rash/hives: denies. Patient is up to date with the following cancer screening tests: physical exam, hysterectomy - no pap smears.  Assessment and Plan: Norma Carlson is a 40 y.o. female with: Urticaria 5-week history of whole body hives.  Tried Benadryl, famotidine, loratadine and prednisone with some benefit.  Denies any changes in diet, medications, personal care products or recent infections. Patient does take opioids for her chronic back pain. Start zyrtec (cetirizine) 10mg  2 tablets twice a day. If hives are not controlled or causes drowsiness let Alphonzo Lemmings know. Start Pepcid (famotidine) 20mg  twice a day.  Avoid the following  potential triggers: alcohol, tight clothing, NSAIDs, hot showers and getting overheated. Read about Xolair injections for hives.  Get bloodwork to rule out other etiologies.  Return in about 4 weeks (around 12/06/2020).  Meds ordered this encounter  Medications   cetirizine (ZYRTEC ALLERGY) 10 MG tablet    Sig: Take 2 tablets (20 mg total) by mouth 2 (two) times daily.    Dispense:  120 tablet    Refill:  3    Lab Orders  CBC with Differential/Platelet  Chronic Urticaria  Comprehensive metabolic panel  C-reactive protein  Sedimentation rate  Thyroid Cascade Profile  Tryptase  Allergens w/Total IgE Area 2  ANA w/Reflex  Alpha-Gal Panel  C3 and C4    Other allergy screening: Asthma: no Rhino conjunctivitis:  Some rhinitis symptoms in the fall and spring. Takes OTC antihistamines with good benefit.  Food allergy: no Medication allergy: no Hymenoptera allergy: no Eczema:no History of recurrent infections suggestive of immunodeficency: no  Diagnostics: None. Unable to skin test today due to recent antihistamine intake.  Past Medical History: Patient Active Problem List   Diagnosis Date Noted   Urticaria 11/08/2020   Recurrent displacement of lumbar disc 10/07/2019   No pertinent past medical history    Past Medical History:  Diagnosis Date   Anxiety    H/O seasonal allergies    No pertinent past medical history    Pneumonia    Recurrent herniation of lumbar disc    Sleep apnea    wears CPAP   Wears glasses    and contacts   Past Surgical History: Past Surgical History:  Procedure Laterality Date   ABDOMINAL HYSTERECTOMY  LEEP  2004   WISDOM TOOTH EXTRACTION     Medication List:  Current Outpatient Medications  Medication Sig Dispense Refill   acetaminophen (TYLENOL) 500 MG tablet Take 500 mg by mouth every 6 (six) hours as needed for mild pain. For pain      albuterol (VENTOLIN HFA) 108 (90 Base) MCG/ACT inhaler SMARTSIG:2 Inhalation By Mouth 3  Times Daily PRN     Ascorbic Acid (VITAMIN C) 1000 MG tablet Take 1,000 mg by mouth daily. With Vitamin D3 and Zinc     BIOTIN PO Take 2,500 mcg by mouth daily.     busPIRone (BUSPAR) 10 MG tablet Take 10 mg by mouth 3 (three) times daily.     cetirizine (ZYRTEC ALLERGY) 10 MG tablet Take 2 tablets (20 mg total) by mouth 2 (two) times daily. 120 tablet 3   cyclobenzaprine (FLEXERIL) 10 MG tablet Take 1 tablet (10 mg total) by mouth 3 (three) times daily as needed for muscle spasms. 30 tablet 0   diazepam (VALIUM) 5 MG tablet Take by mouth.     docusate sodium (COLACE) 100 MG capsule Take 1 capsule (100 mg total) by mouth 2 (two) times daily. 10 capsule 0   DULoxetine (CYMBALTA) 60 MG capsule Take 60 mg by mouth daily.     famotidine (PEPCID) 20 MG tablet Take 20 mg by mouth 2 (two) times daily.     gabapentin (NEURONTIN) 800 MG tablet Take 800 mg by mouth 3 (three) times daily.     Magnesium 250 MG TABS Take 250 mg by mouth at bedtime.     NUCYNTA 50 MG tablet Take 50 mg by mouth every 6 (six) hours as needed.     ondansetron (ZOFRAN) 4 MG tablet Take 1 tablet (4 mg total) by mouth every 6 (six) hours as needed for nausea or vomiting. 20 tablet 0   triamcinolone cream (KENALOG) 0.1 % SMARTSIG:Sparingly Topical 2-3 Times Daily PRN     oxyCODONE 10 MG TABS Take 1 tablet (10 mg total) by mouth every 4 (four) hours as needed for severe pain ((score 7 to 10)). (Patient not taking: Reported on 11/08/2020) 30 tablet 0   No current facility-administered medications for this visit.   Allergies: Allergies  Allergen Reactions   Fluoxetine Nausea Only and Nausea And Vomiting   Fluoxetine Hcl    Social History: Social History   Socioeconomic History   Marital status: Married    Spouse name: Not on file   Number of children: Not on file   Years of education: Not on file   Highest education level: Not on file  Occupational History   Not on file  Tobacco Use   Smoking status: Never   Smokeless  tobacco: Never  Vaping Use   Vaping Use: Never used  Substance and Sexual Activity   Alcohol use: Yes    Comment: social   Drug use: No   Sexual activity: Yes  Other Topics Concern   Not on file  Social History Narrative   Not on file   Social Determinants of Health   Financial Resource Strain: Not on file  Food Insecurity: Not on file  Transportation Needs: Not on file  Physical Activity: Not on file  Stress: Not on file  Social Connections: Not on file   Lives in a house. Smoking: denies Occupation: Administrator History: Water Damage/mildew in the house: no Carpet in the family room: no Carpet in the bedroom: yes Heating: gas Cooling:  central Pet: yes 2 cats x 16 yrs, 13 yrs; 1 dog x 2.5 yrs  Family History: History reviewed. No pertinent family history. Problem                               Relation Asthma                                   No  Eczema                                No  Food allergy                          No  Allergic rhino conjunctivitis     No  Review of Systems  Constitutional:  Negative for appetite change, chills, fever and unexpected weight change.  HENT:  Negative for congestion and rhinorrhea.   Eyes:  Negative for itching.  Respiratory:  Negative for cough, chest tightness, shortness of breath and wheezing.   Cardiovascular:  Negative for chest pain.  Gastrointestinal:  Negative for abdominal pain.  Genitourinary:  Negative for difficulty urinating.  Skin:  Positive for rash.  Neurological:  Negative for headaches.  Objective: BP 132/80   Pulse 97   Temp (!) 97.5 F (36.4 C) (Temporal)   Resp 17   Ht 5' 7.5" (1.715 m)   Wt 240 lb 8 oz (109.1 kg)   SpO2 98%   BMI 37.11 kg/m  Body mass index is 37.11 kg/m. Physical Exam Vitals and nursing note reviewed.  Constitutional:      Appearance: Normal appearance. She is well-developed.  HENT:     Head: Normocephalic and atraumatic.     Right Ear:  Tympanic membrane and external ear normal.     Left Ear: Tympanic membrane and external ear normal.     Nose: Nose normal.     Mouth/Throat:     Mouth: Mucous membranes are moist.     Pharynx: Oropharynx is clear.  Eyes:     Conjunctiva/sclera: Conjunctivae normal.  Cardiovascular:     Rate and Rhythm: Normal rate and regular rhythm.     Heart sounds: Normal heart sounds. No murmur heard.   No friction rub. No gallop.  Pulmonary:     Effort: Pulmonary effort is normal.     Breath sounds: Normal breath sounds. No wheezing, rhonchi or rales.  Musculoskeletal:     Cervical back: Neck supple.  Skin:    General: Skin is warm.     Findings: Rash present.     Comments: Diffuse urticarial rash on lower extremities and upper extremities b/l.  Neurological:     Mental Status: She is alert and oriented to person, place, and time.  Psychiatric:        Behavior: Behavior normal.  The plan was reviewed with the patient/family, and all questions/concerned were addressed.  It was my pleasure to see Norma Carlson today and participate in her care. Please feel free to contact me with any questions or concerns.  Sincerely,  Wyline Mood, DO Allergy & Immunology  [facility] office: [phone] [facility] office: [phone]

## 2020-11-17 LAB — SEDIMENTATION RATE: Sed Rate: 16 mm/hr (ref 0–32)

## 2020-11-17 LAB — CBC WITH DIFFERENTIAL/PLATELET
Basophils Absolute: 0 10*3/uL (ref 0.0–0.2)
Basos: 0 %
EOS (ABSOLUTE): 0.1 10*3/uL (ref 0.0–0.4)
Eos: 1 %
Hematocrit: 40.4 % (ref 34.0–46.6)
Hemoglobin: 13 g/dL (ref 11.1–15.9)
Immature Grans (Abs): 0 10*3/uL (ref 0.0–0.1)
Immature Granulocytes: 0 %
Lymphocytes Absolute: 1.6 10*3/uL (ref 0.7–3.1)
Lymphs: 24 %
MCH: 30 pg (ref 26.6–33.0)
MCHC: 32.2 g/dL (ref 31.5–35.7)
MCV: 93 fL (ref 79–97)
Monocytes Absolute: 0.5 10*3/uL (ref 0.1–0.9)
Monocytes: 7 %
Neutrophils Absolute: 4.3 10*3/uL (ref 1.4–7.0)
Neutrophils: 68 %
Platelets: 339 10*3/uL (ref 150–450)
RBC: 4.34 x10E6/uL (ref 3.77–5.28)
RDW: 12.9 % (ref 11.7–15.4)
WBC: 6.4 10*3/uL (ref 3.4–10.8)

## 2020-11-17 LAB — COMPREHENSIVE METABOLIC PANEL
ALT: 17 IU/L (ref 0–32)
AST: 18 IU/L (ref 0–40)
Albumin/Globulin Ratio: 1.9 (ref 1.2–2.2)
Albumin: 4.3 g/dL (ref 3.8–4.8)
Alkaline Phosphatase: 106 IU/L (ref 44–121)
BUN/Creatinine Ratio: 22 (ref 9–23)
BUN: 16 mg/dL (ref 6–20)
Bilirubin Total: <0.2 mg/dL (ref 0.0–1.2)
CO2: 19 mmol/L — ABNORMAL LOW (ref 20–29)
Calcium: 9 mg/dL (ref 8.7–10.2)
Chloride: 104 mmol/L (ref 96–106)
Creatinine, Ser: 0.72 mg/dL (ref 0.57–1.00)
Globulin, Total: 2.3 g/dL (ref 1.5–4.5)
Glucose: 87 mg/dL (ref 65–99)
Potassium: 3.9 mmol/L (ref 3.5–5.2)
Sodium: 141 mmol/L (ref 134–144)
Total Protein: 6.6 g/dL (ref 6.0–8.5)
eGFR: 110 mL/min/{1.73_m2} (ref >59)

## 2020-11-17 LAB — ALLERGENS W/TOTAL IGE AREA 2

## 2020-11-17 LAB — THYROID CASCADE PROFILE: TSH: 1.35 u[IU]/mL (ref 0.450–4.500)

## 2020-11-17 LAB — C3 AND C4
Complement C3, Serum: 148 mg/dL (ref 82–167)
Complement C4, Serum: 30 mg/dL (ref 12–38)

## 2020-11-17 LAB — ALPHA-GAL PANEL
Allergen Lamb IgE: <0.1 kU/L
Beef IgE: <0.1 kU/L
IgE (Immunoglobulin E), Serum: 20 IU/mL (ref 6–495)
O215-IgE Alpha-Gal: <0.1 kU/L
Pork IgE: <0.1 kU/L

## 2020-11-17 LAB — C-REACTIVE PROTEIN: CRP: 15 mg/L — ABNORMAL HIGH (ref 0–10)

## 2020-11-17 LAB — CHRONIC URTICARIA: cu index: 5.2 (ref <10)

## 2020-11-17 LAB — ANA W/REFLEX: Anti Nuclear Antibody (ANA): NEGATIVE

## 2020-11-17 LAB — TRYPTASE: Tryptase: 6.9 ug/L (ref 2.2–13.2)

## 2020-12-08 NOTE — Patient Instructions (Addendum)
Urticaria  Will hold off on starting Singulair 10 mg due to anxiety Continue Zyrtec (cetirizine) 10mg  2 tablets twice a day. Continue Pepcid (famotidine) 20mg  twice a day.  Avoid the following potential triggers: alcohol, tight clothing, NSAIDs, hot showers and getting overheated. Start Xolair injections for hives. Consent form signed. Prescription sent for EpiPen. Demonstration given    Follow up in 1 month or sooner if needed.

## 2020-12-09 ENCOUNTER — Ambulatory Visit: Payer: BC Managed Care – PPO | Admitting: Family

## 2020-12-09 ENCOUNTER — Encounter: Payer: Self-pay | Admitting: Family

## 2020-12-09 ENCOUNTER — Other Ambulatory Visit: Payer: Self-pay

## 2020-12-09 VITALS — BP 120/82 | HR 108 | Temp 98.1°F | Resp 18 | Ht 68.0 in | Wt 245.6 lb

## 2020-12-09 DIAGNOSIS — L508 Other urticaria: Secondary | ICD-10-CM

## 2020-12-09 MED ORDER — EPINEPHRINE 0.3 MG/0.3ML IJ SOAJ: 0.3000 mg | Freq: Once | INTRAMUSCULAR | 1 refills | Status: AC

## 2020-12-09 MED ORDER — FAMOTIDINE 20 MG PO TABS: 20.0000 mg | ORAL_TABLET | Freq: Two times a day (BID) | ORAL | 1 refills | Status: DC

## 2020-12-09 MED ORDER — CETIRIZINE HCL 10 MG PO TABS: 20.0000 mg | ORAL_TABLET | Freq: Two times a day (BID) | ORAL | 1 refills | Status: DC

## 2020-12-09 NOTE — Progress Notes (Signed)
[place], [address] [place] [address] Dept: [phone]  FOLLOW UP NOTE  Patient ID: Norma Carlson, female    DOB: August 19, 1980  Age: 40 y.o. MRN: [id number] Date of Office Visit: 12/09/2020  Assessment  Chief Complaint: Urticaria  HPI Norma Carlson is a 40 year old female who presents today for follow-up of urticaria.  She was last seen on November 08, 2020 by Dr. Maudie Mercury.  Urticaria is reported as terrible with Zyrtec 10 mg 2 tablets twice a day, Pepcid 20 mg twice a day and Benadryl as needed.  She reports today that she has already taken 4 Benadryl and is using triamcinolone cream.  She reports that her hives will fade as the day goes on, but every morning between 2 and 3:00 in the morning she wakes up itching and has hives.  The hives are all over her body.  She reports that she has been on 3 rounds of steroids since this started approximately 9 weeks ago.  She also mentions that she gets steroid injection every 3 months for her back.  When she saw Dr. Maudie Mercury she denied any fever, chills, changes in medications, foods, personal care products or recent infections.  She reports that she is ready to start Xolair.  She mentions she continues to have shortness of breath/tightness in neck when she has hives on both side of her neck.  If her hives are on one side of the neck she will not have this sensation.  Her C3 and C4 lab work from June was normal.  She also reports that 3 days after seeing Dr. Maudie Mercury both of her ankles have been swollen.  Her right ankle is worse than her left ankle.  She saw her primary care physician who ordered a RA panel and that there was one thing elevated.  She reports that she has seen a rheumatologist, Dr.Truslow, approximately 8 to 9 years ago and was diagnosed with HLA-B 27 and uveitis.   Drug Allergies:  Allergies  Allergen Reactions   Fluoxetine Nausea Only and Nausea And Vomiting   Fluoxetine Hcl     Review of Systems: Review of Systems   Constitutional:  Negative for chills and fever.  HENT:         Denies rhinorrhea, nasal congestion, and postnasal drip.  Eyes:        Denies itchy watery eyes  Respiratory:  Negative for cough, shortness of breath and wheezing.   Cardiovascular:  Negative for chest pain and palpitations.  Gastrointestinal:  Negative for heartburn.       Denies heartburn and reflux symptoms since being on Pepcid twice a day  Genitourinary:  Negative for dysuria.  Skin:  Positive for itching and rash.       Reports urticarial lesions all over her body and itching  Neurological:  Negative for headaches.  Endo/Heme/Allergies:  Negative for environmental allergies.    Physical Exam: BP 120/82 (BP Location: Left Arm, Patient Position: Sitting, Cuff Size: Large)   Pulse (!) 108   Temp 98.1 F (36.7 C) (Temporal)   Resp 18   Ht _0  (1.727 m)   Wt 245 lb 9.6 oz (111.4 kg)   SpO2 97%   BMI 37.34 kg/m    Physical Exam Constitutional:      Appearance: Normal appearance.  HENT:     Head: Normocephalic and atraumatic.     Comments: Pharynx normal, eyes normal, ears normal nose normal    Right Ear: Tympanic membrane, ear canal  and external ear normal.     Left Ear: Tympanic membrane, ear canal and external ear normal.     Nose: Nose normal.     Mouth/Throat:     Mouth: Mucous membranes are moist.     Pharynx: Oropharynx is clear.  Eyes:     Conjunctiva/sclera: Conjunctivae normal.  Cardiovascular:     Rate and Rhythm: Regular rhythm.     Heart sounds: Normal heart sounds.  Pulmonary:     Effort: Pulmonary effort is normal.     Breath sounds: Normal breath sounds.     Comments: Lungs clear to auscultation Musculoskeletal:     Cervical back: Neck supple.  Skin:    General: Skin is warm.     Comments: Urticarial lesions noted on bilateral upper legs, bilateral upper arms, abdomen, and left foot.  Bilateral swelling of the ankles noted right ankle greater than left ankle  Neurological:      Mental Status: She is alert.    Diagnostics: None  Assessment and Plan: 1. Chronic urticaria     Meds ordered this encounter  Medications   cetirizine (ZYRTEC ALLERGY) 10 MG tablet    Sig: Take 2 tablets (20 mg total) by mouth 2 (two) times daily.    Dispense:  120 tablet    Refill:  1   EPINEPHrine 0.3 mg/0.3 mL IJ SOAJ injection    Sig: Inject 0.3 mg into the muscle once for 1 dose.    Dispense:  1 each    Refill:  1   famotidine (PEPCID) 20 MG tablet    Sig: Take 1 tablet (20 mg total) by mouth 2 (two) times daily.    Dispense:  60 tablet    Refill:  1    Patient Instructions  Urticaria  Will hold off on starting Singulair 10 mg due to anxiety Continue Zyrtec (cetirizine) 41m 2 tablets twice a day. Continue Pepcid (famotidine) 252mtwice a day.  Avoid the following potential triggers: alcohol, tight clothing, NSAIDs, hot showers and getting overheated. Start Xolair injections for hives. Consent form signed. Prescription sent for EpiPen. Demonstration given    Follow up in 1 month or sooner if needed.  Return in about 4 weeks (around 01/06/2021), or if symptoms worsen or fail to improve.    Thank you for the opportunity to care for this patient.  Please do not hesitate to contact me with questions.  ChAlthea CharonFNP [facility]

## 2020-12-13 ENCOUNTER — Ambulatory Visit: Payer: BC Managed Care – PPO | Admitting: Allergy

## 2020-12-14 ENCOUNTER — Telehealth: Payer: Self-pay

## 2020-12-14 NOTE — Telephone Encounter (Signed)
Patient called about starting her Xolair injections. She stated that she was waiting on a call from Tammy. Please advise.

## 2020-12-15 NOTE — Telephone Encounter (Signed)
Spoke to patient and advised approval, copay card and submit to [facility]. Will be in touch with delivery date so she can schedule to start therapy

## 2020-12-15 NOTE — Telephone Encounter (Signed)
Thank you :)

## 2020-12-15 NOTE — Telephone Encounter (Signed)
Nehemiah Settle FNP had requested I reach out to patient to discuss starting XOlair for urticaria. L/M for patient to reach out to me to advise approval, copay card and submit to [facility]

## 2020-12-21 NOTE — Telephone Encounter (Signed)
Patient called and states [facility] advised that medication would be delivered to [facility] on 8/5. Patient's hives are uncontrollable and she would like to come in Friday if medication is delivered in time. Informed patient I would be on the look out for her medication and will call her Friday to make an appointment.

## 2020-12-23 ENCOUNTER — Ambulatory Visit (INDEPENDENT_AMBULATORY_CARE_PROVIDER_SITE_OTHER): Payer: BC Managed Care – PPO

## 2020-12-23 ENCOUNTER — Other Ambulatory Visit: Payer: Self-pay

## 2020-12-23 DIAGNOSIS — L501 Idiopathic urticaria: Secondary | ICD-10-CM

## 2020-12-23 MED ORDER — OMALIZUMAB 150 MG/ML ~~LOC~~ SOSY
300.0000 mg | PREFILLED_SYRINGE | Freq: Once | SUBCUTANEOUS | Status: AC
  Administered 2020-12-23: 300 mg via SUBCUTANEOUS

## 2020-12-28 ENCOUNTER — Encounter: Payer: Self-pay | Admitting: Allergy & Immunology

## 2020-12-28 ENCOUNTER — Ambulatory Visit: Payer: BC Managed Care – PPO | Admitting: Allergy & Immunology

## 2020-12-28 ENCOUNTER — Other Ambulatory Visit: Payer: Self-pay

## 2020-12-28 VITALS — BP 126/86 | HR 118 | Temp 98.7°F | Resp 18 | Ht 68.0 in | Wt 248.4 lb

## 2020-12-28 DIAGNOSIS — L508 Other urticaria: Secondary | ICD-10-CM | POA: Diagnosis not present

## 2020-12-28 NOTE — Patient Instructions (Addendum)
1. Chronic urticaria - We are going to make some changes today to see if we can improve your control. - Change cetirizine to 20mg  at noon only (drop the morning and evening doses). - Continue with Pepcid twice daily. - Add on Allegra two tablets twice daily. - Add on Singulair (montelukast) 10mg  at night.  - Add on doxepin 25mg  at night.  - Prednisone burst sent in (start after Monday due to your epidural you are having placed).  2. Return in about 7 weeks (around 02/17/2021).    Please inform of any Emergency Department visits, hospitalizations, or changes in symptoms. Call Sunday before going to the ED for breathing or allergy symptoms since we might be able to fit you in for a sick visit. Feel free to contact 02/19/2021 anytime with any questions, problems, or concerns.  It was a pleasure to meet you today!  Websites that have reliable patient information: 1. American Academy of Asthma, Allergy, and Immunology: www.aaaai.org 2. Food Allergy Research and Education (FARE): foodallergy.org 3. Mothers of Asthmatics: http://www.asthmacommunitynetwork.org 4. American College of Allergy, Asthma, and Immunology: www.acaai.org   COVID-19 Vaccine Information can be found at: [place] For questions related to vaccine distribution or appointments, please email [email or web address] or call [phone].   We realize that you might be concerned about having an allergic reaction to the COVID19 vaccines. To help with that concern, WE ARE OFFERING THE COVID19 VACCINES IN OUR OFFICE! Ask the front desk for dates!     "Like" [place] on Facebook and Instagram for our latest updates!      A healthy democracy works best when [email or web address] participate! Make sure you are registered to vote! If you have moved or changed any of your contact information, you will need to get this updated before voting!  In some cases, you MAY be able to register to vote  online: [phone]

## 2020-12-28 NOTE — Progress Notes (Signed)
FOLLOW UP  Date of Service/Encounter:  12/28/20   Assessment:   Chronic urticaria - on Xolair with inadequate control at this time  Plan/Recommendations:   1. Chronic urticaria - We are going to make some changes today to see if we can improve your control. - Change cetirizine to 20mg  at noon only (drop the morning and evening doses). - Continue with Pepcid twice daily. - Add on Allegra two tablets twice daily. - Add on Singulair (montelukast) 10mg  at night.  - Side effects of montelukast  - Add on doxepin 25mg  at night.  - Prednisone burst sent in (start after Monday due to your epidural you are having placed).  2. Return in about 7 weeks (around 02/17/2021).    Subjective:   Norma Carlson is a 40 y.o. female presenting today for follow up of  Chief Complaint  Patient presents with   Urticaria    Arms, legs, face, neck, stomach, hands/feet, and back - 3 months - 2 zyrtec twice a day, and 2 Pepcid twice a day ( helped at first and now no relief) - also had a Xolair injection last Friday - no change in symptoms    Pruritus    Hot and itching hives     Norma Carlson has a history of the following: Patient Active Problem List   Diagnosis Date Noted   Urticaria 11/08/2020   Recurrent displacement of lumbar disc 10/07/2019   No pertinent past medical history     History obtained from: chart review and patient.  Norma Carlson is a 40 y.o. female presenting for a follow up visit.  She was last seen in July 2022 by Alphonzo Lemmings.  At that time, urticaria was not well controlled with Zyrtec 20 mg twice daily and Pepcid 20 mg twice daily.  She was not started on Singulair due to a history of anxiety.  She was started on Xolair for her hives.  She has only received 1 injection thus far.  She has had extensive lab work-up that has been unrevealing aside from mildly elevated CRP.  She is on cetirizine 20mg  BID and Pepcid 20mg  BID. She did start Xolair last week. She tolerated  it fine. She has continued to have issues. She has been on prednisone 3 times and it completely comes back when she is off of it. She has started getting them on her hands and joints very badly. She started off on a different antihistamine. She is on Benadryl throughout the day. She never had these when she was younger. She does report some throat closure and she has used an inhaler the last couple of nights. She had the inhaler from when she had pneumonia or the flu last year.   She has otherwise done well.  She does have an EpiPen in place.  She has not needed to use it.  She is hoping that the Xolair helps with her urticaria.  She has no joint swelling with these urticaria.  Otherwise, there have been no changes to her past medical history, surgical history, family history, or social history.    Review of Systems  Constitutional: Negative.  Negative for chills, fever, malaise/fatigue and weight loss.  HENT: Negative.  Negative for congestion, ear discharge and ear pain.   Eyes:  Negative for pain, discharge and redness.  Respiratory:  Negative for cough, sputum production, shortness of breath and wheezing.   Cardiovascular: Negative.  Negative for chest pain and palpitations.  Gastrointestinal:  Negative for abdominal  pain, heartburn and nausea.  Skin:  Positive for itching and rash.  Neurological:  Negative for dizziness and headaches.  Endo/Heme/Allergies:  Negative for environmental allergies. Does not bruise/bleed easily.      Objective:   Blood pressure 126/86, pulse (!) 118, temperature 98.7 F (37.1 C), resp. rate 18, height 5\' 8"  (1.727 m), weight 248 lb 6.4 oz (112.7 kg), SpO2 100 %, unknown if currently breastfeeding. Body mass index is 37.77 kg/m.   Physical Exam:  Physical Exam Constitutional:      Appearance: She is well-developed.  HENT:     Head: Normocephalic and atraumatic.     Right Ear: Tympanic membrane, ear canal and external ear normal.     Left Ear:  Tympanic membrane, ear canal and external ear normal.     Nose: No nasal deformity, septal deviation, mucosal edema or rhinorrhea.     Right Turbinates: Not enlarged or swollen.     Left Turbinates: Not enlarged or swollen.     Right Sinus: No maxillary sinus tenderness or frontal sinus tenderness.     Left Sinus: No maxillary sinus tenderness or frontal sinus tenderness.     Mouth/Throat:     Mouth: Mucous membranes are not pale and not dry.     Pharynx: Uvula midline.  Eyes:     General: Lids are normal. No allergic shiner.       Right eye: No discharge.        Left eye: No discharge.     Conjunctiva/sclera: Conjunctivae normal.     Right eye: Right conjunctiva is not injected. No chemosis.    Left eye: Left conjunctiva is not injected. No chemosis.    Pupils: Pupils are equal, round, and reactive to light.  Cardiovascular:     Rate and Rhythm: Normal rate and regular rhythm.     Heart sounds: Normal heart sounds.  Pulmonary:     Effort: Pulmonary effort is normal. No tachypnea, accessory muscle usage or respiratory distress.     Breath sounds: Normal breath sounds. No wheezing, rhonchi or rales.  Chest:     Chest wall: No tenderness.  Lymphadenopathy:     Cervical: No cervical adenopathy.  Skin:    General: Skin is warm.     Capillary Refill: Capillary refill takes less than 2 seconds.     Coloration: Skin is not pale.     Findings: Rash present. No abrasion, erythema or petechiae. Rash is urticarial. Rash is not papular or vesicular.     Comments: Urticarial lesions over her bilateral legs and arms.  Neurological:     Mental Status: She is alert.  Psychiatric:        Behavior: Behavior is cooperative.     Diagnostic studies: none       , MD  [facility]

## 2020-12-29 MED ORDER — MONTELUKAST SODIUM 10 MG PO TABS: 10.0000 mg | ORAL_TABLET | Freq: Every day | ORAL | 1 refills | Status: DC

## 2020-12-29 MED ORDER — DOXEPIN HCL 25 MG PO CAPS: 25.0000 mg | ORAL_CAPSULE | Freq: Every day | ORAL | 5 refills | Status: DC

## 2020-12-29 MED ORDER — PREDNISONE 10 MG PO TABS: ORAL_TABLET | ORAL | 0 refills | Status: DC

## 2020-12-30 ENCOUNTER — Telehealth: Payer: Self-pay | Admitting: *Deleted

## 2020-12-30 ENCOUNTER — Encounter: Payer: Self-pay | Admitting: Allergy & Immunology

## 2020-12-30 NOTE — Telephone Encounter (Signed)
Called and spoke with the patient and she stated that she is doing good and this morning was the first morning that she woke out without hives. She states that she started taking the Montelukast last night and so far has not had any issues.

## 2021-01-12 ENCOUNTER — Telehealth: Payer: Self-pay | Admitting: *Deleted

## 2021-01-12 ENCOUNTER — Other Ambulatory Visit: Payer: Self-pay | Admitting: *Deleted

## 2021-01-12 MED ORDER — PREDNISONE 10 MG PO TABS: ORAL_TABLET | ORAL | 0 refills | Status: DC

## 2021-01-12 NOTE — Telephone Encounter (Signed)
Called and spoke with patient and advised of Prednisone and Xolair. Patient verbalized understanding and did confirm that she is still taking all other medications. Prednisone has been sent in to the patient's pharmacy. Patient verbalized understanding.

## 2021-01-12 NOTE — Telephone Encounter (Signed)
Patient called and stated that with her medications and being on Prednisone 40mg  daily she is still having an outbreak of hives and she is miserable. She was wondering if she can get her Xolair sooner. I advised that giving it to her sooner may interfere with her insurance but that I would reach out to you do discuss. Please provide recommendation.

## 2021-01-12 NOTE — Telephone Encounter (Signed)
Xolair will not be approved for every 2 weeks until she has been on it for 3 months.  We can up her prednisone and taper over a longer period of time: 30mg  BID x 5 days, 20mg  BID x 5 days, 10mg  BID x 5 days, then STOP.  Make sure she is taking all of her other medications:   Allegra 2 tablets + Pepcid 40 mg in the morning  Zyrtec 2 tablets at noon  Allegra 2 tablets + Pepcid 40 mg + Singulair 10 mg + doxepin 2 tablets at night    , MD [facility]

## 2021-01-20 ENCOUNTER — Other Ambulatory Visit: Payer: Self-pay

## 2021-01-20 ENCOUNTER — Ambulatory Visit (INDEPENDENT_AMBULATORY_CARE_PROVIDER_SITE_OTHER): Payer: BC Managed Care – PPO

## 2021-01-20 DIAGNOSIS — L501 Idiopathic urticaria: Secondary | ICD-10-CM

## 2021-01-20 MED ORDER — OMALIZUMAB 150 MG/ML ~~LOC~~ SOSY
300.0000 mg | PREFILLED_SYRINGE | SUBCUTANEOUS | Status: DC
  Administered 2021-01-20 – 2021-03-15 (×3): 300 mg via SUBCUTANEOUS

## 2021-01-31 DIAGNOSIS — M542 Cervicalgia: Secondary | ICD-10-CM | POA: Insufficient documentation

## 2021-02-06 ENCOUNTER — Telehealth: Payer: Self-pay

## 2021-02-06 MED ORDER — PREDNISONE 10 MG PO TABS
ORAL_TABLET | ORAL | 0 refills | Status: DC
Start: 2021-02-06 — End: 2021-02-15

## 2021-02-06 NOTE — Addendum Note (Signed)
Addended by: Alfonse Spruce on: 02/06/2021 08:48 PM   Modules accepted: Orders

## 2021-02-06 NOTE — Telephone Encounter (Signed)
Pended a shorter course. Prednisone is not a long term treatment.   Malachi Bonds, MD [facility]

## 2021-02-06 NOTE — Telephone Encounter (Signed)
ERROR

## 2021-02-06 NOTE — Telephone Encounter (Signed)
Dr. Dellis Anes is it okay to send in a refill on the prednisone medication?  [facility] in [place] [phone]

## 2021-02-06 NOTE — Telephone Encounter (Signed)
Patient called to request a refill on the Prednisone.  Patient states the Pepcid will need to be sent in as a 90 day refill due to the patients insurance.   [facility] [employer]

## 2021-02-07 ENCOUNTER — Other Ambulatory Visit: Payer: Self-pay | Admitting: *Deleted

## 2021-02-07 ENCOUNTER — Ambulatory Visit: Payer: BC Managed Care – PPO | Admitting: Allergy & Immunology

## 2021-02-07 MED ORDER — FAMOTIDINE 20 MG PO TABS
20.0000 mg | ORAL_TABLET | Freq: Two times a day (BID) | ORAL | 1 refills | Status: DC
Start: 2021-02-07 — End: 2021-10-05

## 2021-02-07 NOTE — Telephone Encounter (Signed)
90 day refill for Pepcid has been sent in. Called patient and advised of Pepcid and short course of Prednisone. Patient verbalized understanding.

## 2021-02-15 ENCOUNTER — Encounter: Payer: Self-pay | Admitting: Allergy & Immunology

## 2021-02-15 ENCOUNTER — Ambulatory Visit: Payer: BC Managed Care – PPO

## 2021-02-15 ENCOUNTER — Ambulatory Visit: Payer: BC Managed Care – PPO | Admitting: Allergy & Immunology

## 2021-02-15 ENCOUNTER — Other Ambulatory Visit: Payer: Self-pay

## 2021-02-15 VITALS — BP 134/90 | HR 124 | Temp 98.1°F | Resp 20 | Ht 69.0 in | Wt 249.2 lb

## 2021-02-15 DIAGNOSIS — Z1589 Genetic susceptibility to other disease: Secondary | ICD-10-CM | POA: Diagnosis not present

## 2021-02-15 DIAGNOSIS — Z8669 Personal history of other diseases of the nervous system and sense organs: Secondary | ICD-10-CM

## 2021-02-15 DIAGNOSIS — L501 Idiopathic urticaria: Secondary | ICD-10-CM | POA: Diagnosis not present

## 2021-02-15 MED ORDER — PREDNISONE 10 MG PO TABS: 10.0000 mg | ORAL_TABLET | Freq: Two times a day (BID) | ORAL | 0 refills | Status: DC

## 2021-02-15 MED ORDER — PREDNISONE 10 MG PO TABS: 10.0000 mg | ORAL_TABLET | Freq: Two times a day (BID) | ORAL | 0 refills | Status: AC

## 2021-02-15 NOTE — Progress Notes (Signed)
FOLLOW UP  Date of Service/Encounter:  02/15/21   Assessment:   Chronic urticaria - on Xolair with inadequate control at this time (going to attempt increasing to every 14 days, but may consider cyclosporine if this is not effective)  HLA-B27 positive - pending appointment with Dr. Vernelle Emerald (Rheumatology)  History of uveitis  Plan/Recommendations:   1. Chronic urticaria - with inadequate control with Xolair every 28 days - We are going to try to increase to every 14 day dosing.  - Continue with cetirizine to 8m at noon only. - Continue with Pepcid twice daily. - Continue with Allegra two tablets twice daily. - Continue with Singulair (montelukast) 118mat night.  - Continue with doxepin 2565mt night.  - Continue with prednisone 48m52mice daily as you are doing.  - We might consider starting cyclosporine if the Xolair is not helpful.  2. Return in about 6 weeks (around 03/29/2021).     Subjective:   Norma Carlson 40 y78. female presenting today for follow up of  Chief Complaint  Patient presents with   Follow-up    7 week f/u    Norma Carlson a history of the following: Patient Active Problem List   Diagnosis Date Noted   Urticaria 11/08/2020   Recurrent displacement of lumbar disc 10/07/2019   No pertinent past medical history     History obtained from: chart review and patient.  Norma Carlson 40 y79. female presenting for a follow up visit.  We last saw her in August 2022.  At that time, we changed cetirizine to 20 mg at night and only.  We continued Pepcid twice daily.  We added Allegra 2 tablets twice daily.  We did add Singulair 10 mg at night.  We added on doxepin 25 mg at night as well.  We gave her a prednisone burst and continue with Xolair every 28 days.  We were not able to get Xolair approved for every 2 weeks.  Since the last visit, she has continued to have breakthrough issues. She reports a 50% improvement for a few days.  But then it is full force hives. She has been doing 48mg26m for her prednisone despite the taper that I wrote. She is having a a number of issues and it is affecting her quality of life. She forgot her nighttime doses at one point during a long day and she had hives over her head and scalp. She woke up in the middle of the night.   She does have a history of uveitis. She was diagnosed in high school and sees an optometrist [place]Dr. R[place]ene [place] steroid injections in her eyes to treat this. Ten years ago, she had blood that showed that she was HLA-B27 positive. She went to her retinal specialist (Dr. PaulaDeanne Coffer[place] was diagnosed with a "busted blood vessel". It has not cleared at all. Her last injection was in March 2022. She gets a recurrence once every 2 years.   She does have an appointment with Dr. ChrisVernelle Emeraldctober 14th.  She had a negative ANA recently as well as an elevated CRP of 15.  Otherwise, there have been no changes to her past medical history, surgical history, family history, or social history.    Review of Systems  Constitutional: Negative.  Negative for fever, malaise/fatigue and weight loss.       Positive for weight gain.  HENT: Negative.  Negative for congestion, ear discharge and ear pain.   Eyes:  Positive for redness. Negative for pain and discharge.  Respiratory:  Negative for cough, sputum production, shortness of breath and wheezing.   Cardiovascular: Negative.  Negative for chest pain and palpitations.  Gastrointestinal:  Negative for abdominal pain, heartburn, nausea and vomiting.  Skin:  Positive for itching and rash.  Neurological:  Negative for dizziness and headaches.  Endo/Heme/Allergies:  Negative for environmental allergies. Does not bruise/bleed easily.      Objective:   Blood pressure 134/90, pulse (!) 124, temperature 98.1 F (36.7 C), temperature source Temporal, resp. rate 20, height _0  (1.753 m), weight  249 lb 3.2 oz (113 kg), SpO2 98 %, not currently breastfeeding. Body mass index is 36.8 kg/m.   Physical Exam:  Physical Exam Vitals reviewed.  Constitutional:      Appearance: She is well-developed.  HENT:     Head: Normocephalic and atraumatic.     Right Ear: Tympanic membrane, ear canal and external ear normal.     Left Ear: Tympanic membrane, ear canal and external ear normal.     Nose: No nasal deformity, septal deviation, mucosal edema or rhinorrhea.     Right Turbinates: Enlarged and swollen.     Left Turbinates: Enlarged and swollen.     Right Sinus: No maxillary sinus tenderness or frontal sinus tenderness.     Left Sinus: No maxillary sinus tenderness or frontal sinus tenderness.     Mouth/Throat:     Mouth: Mucous membranes are not pale and not dry.     Pharynx: Uvula midline.  Eyes:     General: Lids are normal. No allergic shiner.       Right eye: No discharge.        Left eye: No discharge.     Conjunctiva/sclera: Conjunctivae normal.     Right eye: Right conjunctiva is not injected. No chemosis.    Left eye: Left conjunctiva is not injected. No chemosis.    Pupils: Pupils are equal, round, and reactive to light.  Cardiovascular:     Rate and Rhythm: Normal rate and regular rhythm.     Heart sounds: Normal heart sounds.  Pulmonary:     Effort: Pulmonary effort is normal. No tachypnea, accessory muscle usage or respiratory distress.     Breath sounds: Normal breath sounds. No wheezing, rhonchi or rales.  Chest:     Chest wall: No tenderness.  Lymphadenopathy:     Cervical: No cervical adenopathy.  Skin:    General: Skin is warm.     Capillary Refill: Capillary refill takes less than 2 seconds.     Coloration: Skin is not pale.     Findings: No abrasion, erythema, petechiae or rash. Rash is not papular, urticarial or vesicular.     Comments: No urticaria noted.  Neurological:     Mental Status: She is alert.  Psychiatric:        Behavior: Behavior is  cooperative.     Diagnostic studies: none       Salvatore Marvel, MD  [facility]

## 2021-02-15 NOTE — Patient Instructions (Addendum)
1. Chronic urticaria - We are going to try to increase to every 14 day dosing.  - Continue with cetirizine to 20mg  at noon only. - Continue with Pepcid twice daily. - Continue with Allegra two tablets twice daily. - Continue with Singulair (montelukast) 10mg  at night.  - Continue with doxepin 25mg  at night.  - Continue with prednisone 10mg  twice daily as you are doing.   2. Return in about 6 weeks (around 03/29/2021).    Please inform of any Emergency Department visits, hospitalizations, or changes in symptoms. Call before going to the ED for breathing or allergy symptoms since we might be able to fit you in for a sick visit. Feel free to contact anytime with any questions, problems, or concerns.  It was a pleasure to see you again today!  Websites that have reliable patient information: 1. American Academy of Asthma, Allergy, and Immunology: www.aaaai.org 2. Food Allergy Research and Education (FARE): foodallergy.org 3. Mothers of Asthmatics: http://www.asthmacommunitynetwork.org 4. American College of Allergy, Asthma, and Immunology: www.acaai.org   COVID-19 Vaccine Information can be found at: 13/01/2021 For questions related to vaccine distribution or appointments, please email [email or web address] or call [phone].   We realize that you might be concerned about having an allergic reaction to the COVID19 vaccines. To help with that concern, WE ARE OFFERING THE COVID19 VACCINES IN OUR OFFICE! Ask the front desk for dates!     "Like" [place] on Facebook and Instagram for our latest updates!      A healthy democracy works best when [place] participate! Make sure you are registered to vote! If you have moved or changed any of your contact information, you will need to get this updated before voting!  In some cases, you MAY be able to register to vote online:  [email or web address]

## 2021-02-17 ENCOUNTER — Ambulatory Visit: Payer: BC Managed Care – PPO

## 2021-02-20 ENCOUNTER — Telehealth: Payer: Self-pay | Admitting: *Deleted

## 2021-02-20 MED ORDER — OMALIZUMAB 150 MG/ML ~~LOC~~ SOSY: 300.0000 mg | PREFILLED_SYRINGE | SUBCUTANEOUS | 11 refills | Status: DC

## 2021-02-20 NOTE — Telephone Encounter (Signed)
L/m for patient to contact me to advise will go ahead with dose increase for Xolair and will likely be shipped with next dose.appt 10/26. She can then plan on every 14 days   Alfonse Spruce, MD  Devoria Glassing, CMA Can we try increasing to every 14 days now?

## 2021-02-20 NOTE — Telephone Encounter (Signed)
Spoke to patient and advised change to every 14 days with next ship. Not able to ship any sooner per Ins

## 2021-03-02 NOTE — Progress Notes (Signed)
Office Visit Note  Patient: Norma Carlson             Date of Birth: 1980-09-20           MRN: [id number]             PCP: Rosine Door Referring: Lanelle Bal, PA-C Visit Date: 03/03/2021  Subjective:   History of Present Illness: Norma Carlson is a 40 y.o. female here for evaluation with positive HLA-B27 allele and history of uveitis with joint pains and urticarial rashes. Starting in April this year she has had ongoing rashes diagnosed as chronic idiopathic urticaria, fairly refractory to treatments with ongoing allergy management including cetirizine, famotidine, singulair, xolair and recurrent prednisone treatments. She was originally diagnosed with uveitis in high school and sees her optometrist in [place] with intermittent intraocular steroid injections for management, last March 2022. However for decades there have been no extraocular symptoms or complications, and never been on treatment outside of as needed steroids. In addition to the skin rashes she has experienced multiple symptoms including diffuse skin flushing and tachycardia episodes. She denies any loss of consciousness or frequent palpitations. She has had some weight gain with the recurrent prednisone treatments during this year.  Labs reviewed 10/2020 ANA neg Complement C3 C4 wnl ESR 16 CRP 15 CBC wnl CMP unremarkable  Activities of Daily Living:  Patient reports morning stiffness for 0 minutes.   Patient Reports nocturnal pain.  Difficulty dressing/grooming: Denies Difficulty climbing stairs: Denies Difficulty getting out of chair: Reports Difficulty using hands for taps, buttons, cutlery, and/or writing: Denies  Review of Systems  Constitutional:  Negative for fatigue.  HENT:  Negative for mouth sores, mouth dryness and nose dryness.   Eyes:  Negative for pain, redness, itching and dryness.  Respiratory:  Positive for shortness of breath. Negative for difficulty breathing.    Cardiovascular:  Negative for chest pain and palpitations.  Gastrointestinal:  Positive for constipation. Negative for blood in stool and diarrhea.  Endocrine: Negative for increased urination.  Genitourinary:  Negative for difficulty urinating.  Musculoskeletal:  Positive for joint pain, joint pain and joint swelling. Negative for myalgias, morning stiffness, muscle tenderness and myalgias.  Skin:  Positive for rash and redness. Negative for color change.  Allergic/Immunologic: Negative for susceptible to infections.  Neurological:  Positive for numbness. Negative for dizziness, headaches, memory loss and weakness.  Hematological:  Positive for bruising/bleeding tendency.  Psychiatric/Behavioral:  Negative for confusion.    PMFS History:  Patient Active Problem List   Diagnosis Date Noted   Uveitis 03/03/2021   High risk medication use 03/03/2021   Neck pain 01/31/2021   Urticaria 11/08/2020   Opioid dependence ([place]) 04/12/2020   Other recurrent vertebral dislocation, lumbosacral region 12/18/2019   Recurrent displacement of lumbar disc 10/07/2019   Body mass index (BMI) 36.0-36.9, adult 08/18/2019   Essential (primary) hypertension 08/18/2019   Lumbosacral radiculopathy 04/21/2019   Status post bilateral salpingectomy 12/01/2018   No pertinent past medical history     Past Medical History:  Diagnosis Date   Anxiety    H/O seasonal allergies    No pertinent past medical history    Pneumonia    Recurrent herniation of lumbar disc    Sleep apnea    wears CPAP   Urticaria    Wears glasses    and contacts    Family History  Problem Relation Age of Onset   HLA-B27 positive Paternal Uncle    Uveitis Paternal Uncle  Past Surgical History:  Procedure Laterality Date   ABDOMINAL HYSTERECTOMY     BACK SURGERY     04/2019 microdiscectomy, 09/2019 lumbar fusion   LEEP  2004   TYMPANOSTOMY TUBE PLACEMENT     WISDOM TOOTH EXTRACTION     Social History   Social History  Narrative   Not on file   Immunization History  Administered Date(s) Administered   Influenza Split 04/07/2011   Influenza-Unspecified 02/18/2018, 02/19/2019   Tdap 04/07/2011     Objective: Vital Signs: BP (!) 143/92 (BP Location: Right Arm, Patient Position: Sitting, Cuff Size: Large)   Pulse (!) 103   Ht 5' 7.75" (1.721 m)   Wt 249 lb 9.6 oz (113.2 kg)   BMI 38.23 kg/m    Physical Exam HENT:     Mouth/Throat:     Mouth: Mucous membranes are moist.     Pharynx: Oropharynx is clear.  Eyes:     Comments: R>L conjunctival erythema  Cardiovascular:     Rate and Rhythm: Regular rhythm. Tachycardia present.  Pulmonary:     Effort: Pulmonary effort is normal.     Breath sounds: Normal breath sounds.  Lymphadenopathy:     Cervical: No cervical adenopathy.  Skin:    General: Skin is warm and dry.     Findings: Rash present.     Comments: Diffuse faint blanching erythema on face chest and upper arms Hives scattered on back and legs bilaterally  Neurological:     Mental Status: She is alert.     Musculoskeletal Exam:  Shoulders full ROM no tenderness or swelling Elbows full ROM no tenderness or swelling Wrists full ROM no tenderness or swelling Fingers full ROM no tenderness or swelling Knees full ROM no tenderness or swelling Ankles full ROM no tenderness or swelling MTPs full ROM no tenderness or swelling   Investigation: No additional findings.  Imaging: DG Chest 2 View  Result Date: 03/03/2021 CLINICAL DATA:  Pre methotrexate therapy EXAM: CHEST - 2 VIEW COMPARISON:  None FINDINGS: Normal heart size, mediastinal contours, and pulmonary vascularity. Lungs clear. No pleural effusion or pneumothorax. Bones unremarkable. IMPRESSION: Normal exam. Electronically Signed   By: Lavonia Dana M.D.   On: 03/03/2021 17:46    Recent Labs: Lab Results  Component Value Date   WBC 6.4 11/08/2020   HGB 13.0 11/08/2020   PLT 339 11/08/2020   NA 141 11/08/2020   K 3.9  11/08/2020   CL 104 11/08/2020   CO2 19 (L) 11/08/2020   GLUCOSE 87 11/08/2020   BUN 16 11/08/2020   CREATININE 0.72 11/08/2020   BILITOT <0.2 11/08/2020   ALKPHOS 106 11/08/2020   AST 18 11/08/2020   ALT 17 11/08/2020   PROT 6.6 11/08/2020   ALBUMIN 4.3 11/08/2020   CALCIUM 9.0 11/08/2020   GFRAA >60 10/08/2019    Speciality Comments: No specialty comments available.  Procedures:  No procedures performed Allergies: Fluoxetine and Fluoxetine hcl   Assessment / Plan:     Visit Diagnoses: Urticaria  Active, ongoing urticaria pretty diffusely active today despite treatment with 20 mg prednisone and antihistamines and starting xolair. Arthralgias can be associated with chronic urticaria in the absence of overt inflammatory arthritis. I believe she would benefit with trial of systemic DMARD treatment probably methotrexate. She is having some side effects already with the ongoing use of prednisone so needs better disease control.  Uveitis  Chronic intermittent uveitis with positive HLA-B27 no systemic disease. Episodes usually occurring at least 1-2 times annually,  may also benefit with DMARD treatment to reduce this disease activity.  High risk medication use - Plan: DG Chest 2 View, Hepatitis B core antibody, IgM, Hepatitis B surface antigen, Hepatitis C antibody, IgG, IgA, IgM  Anticipate trial of methotrexate treatment will check baseline hepatitis serology and chest xray. Also checking baseline immunoglobulins prior to treatment.  Orders: Orders Placed This Encounter  Procedures   DG Chest 2 View   Hepatitis B core antibody, IgM   Hepatitis B surface antigen   Hepatitis C antibody   IgG, IgA, IgM    No orders of the defined types were placed in this encounter.    Follow-Up Instructions: Return in about 5 weeks (around 04/07/2021) for New pt CIU/uveitis MTX start f/u 5wks.   Collier Salina, MD  Note - This record has been created using [employer].  Chart  creation errors have been sought, but may not always  have been located. Such creation errors do not reflect on  the standard of medical care.

## 2021-03-03 ENCOUNTER — Ambulatory Visit
Admission: RE | Admit: 2021-03-03 | Discharge: 2021-03-03 | Disposition: A | Discharge status: Home / Self Care | Payer: BC Managed Care – PPO | Source: Ambulatory Visit | Attending: Internal Medicine | Admitting: Internal Medicine

## 2021-03-03 ENCOUNTER — Other Ambulatory Visit: Payer: Self-pay

## 2021-03-03 ENCOUNTER — Encounter: Payer: Self-pay | Admitting: Internal Medicine

## 2021-03-03 ENCOUNTER — Ambulatory Visit: Payer: BC Managed Care – PPO | Admitting: Internal Medicine

## 2021-03-03 VITALS — BP 143/92 | HR 103 | Ht 67.75 in | Wt 249.6 lb

## 2021-03-03 DIAGNOSIS — Z79899 Other long term (current) drug therapy: Secondary | ICD-10-CM

## 2021-03-03 DIAGNOSIS — H209 Unspecified iridocyclitis: Secondary | ICD-10-CM | POA: Diagnosis not present

## 2021-03-03 DIAGNOSIS — L509 Urticaria, unspecified: Secondary | ICD-10-CM

## 2021-03-03 NOTE — Patient Instructions (Signed)
Methotrexate Tablets What is this medication? METHOTREXATE (METH oh TREX ate) treats inflammatory conditions such as arthritis and psoriasis. It works by decreasing inflammation, which can reduce pain and prevent long-term injury to the joints and skin. It may also be used to treat some types of cancer. It works by slowing down the growth of cancer cells. This medicine may be used for other purposes; ask your health care provider or pharmacist if you have questions. COMMON BRAND NAME(S): Rheumatrex, Trexall What should I tell my care team before I take this medication? They need to know if you have any of these conditions: Fluid in the stomach area or lungs If you often drink alcohol Infection or immune system problems Kidney disease or on hemodialysis Liver disease Low blood counts, like low white cell, platelet, or red cell counts Lung disease Radiation therapy Stomach ulcers Ulcerative colitis An unusual or allergic reaction to methotrexate, other medications, foods, dyes, or preservatives Pregnant or trying to get pregnant Breast-feeding How should I use this medication? Take this medication by mouth with a glass of water. Follow the directions on the prescription label. Take your medication at regular intervals. Do not take it more often than directed. Do not stop taking except on your care team's advice. Make sure you know why you are taking this medication and how often you should take it. If this medication is used for a condition that is not cancer, like arthritis or psoriasis, it should be taken weekly, NOT daily. Taking this medication more often than directed can cause serious side effects, even death. Talk to your care team about safe handling and disposal of this medication. You may need to take special precautions. Talk to your care team about the use of this medication in children. While this medication may be prescribed for selected conditions, precautions do  apply. Overdosage: If you think you have taken too much of this medicine contact a poison control center or emergency room at once. NOTE: This medicine is only for you. Do not share this medicine with others. What if I miss a dose? If you miss a dose, talk with your care team. Do not take double or extra doses. What may interact with this medication? Do not take this medication with any of the following: Acitretin This medication may also interact with the following: Aspirin and aspirin-like medications including salicylates Azathioprine Certain antibiotics like penicillins, tetracycline, and chloramphenicol Certain medications that treat or prevent blood clots like warfarin, apixaban, dabigatran, and rivaroxaban Certain medications for stomach problems like esomeprazole, omeprazole, pantoprazole Cyclosporine Dapsone Diuretics Gold Hydroxychloroquine Live virus vaccines Medications for infection like acyclovir, adefovir, amphotericin B, bacitracin, cidofovir, foscarnet, ganciclovir, gentamicin, pentamidine, vancomycin Mercaptopurine NSAIDs, medications for pain and inflammation, like ibuprofen or naproxen Other cytotoxic agents Pamidronate Pemetrexed Penicillamine Phenylbutazone Phenytoin Probenecid Pyrimethamine Retinoids such as isotretinoin and tretinoin Steroid medications like prednisone or cortisone Sulfonamides like sulfasalazine and trimethoprim/sulfamethoxazole Theophylline Zoledronic acid This list may not describe all possible interactions. Give your health care provider a list of all the medicines, herbs, non-prescription drugs, or dietary supplements you use. Also tell them if you smoke, drink alcohol, or use illegal drugs. Some items may interact with your medicine. What should I watch for while using this medication? Avoid alcoholic drinks. This medication can make you more sensitive to the sun. Keep out of the sun. If you cannot avoid being in the sun, wear  protective clothing and use sunscreen. Do not use sun lamps or tanning beds/booths. You may need   blood work done while you are taking this medication. Call your care team for advice if you get a fever, chills or sore throat, or other symptoms of a cold or flu. Do not treat yourself. This medication decreases your body's ability to fight infections. Try to avoid being around people who are sick. This medication may increase your risk to bruise or bleed. Call your care team if you notice any unusual bleeding. Be careful brushing or flossing your teeth or using a toothpick because you may get an infection or bleed more easily. If you have any dental work done, tell your dentist you are receiving this medication. Check with your care team if you get an attack of severe diarrhea, nausea and vomiting, or if you sweat a lot. The loss of too much body fluid can make it dangerous for you to take this medication. Talk to your care team about your risk of cancer. You may be more at risk for certain types of cancers if you take this medication. Do not become pregnant while taking this medication or for 6 months after stopping it. Women should inform their care team if they wish to become pregnant or think they might be pregnant. Men should not father a child while taking this medication and for 3 months after stopping it. There is potential for serious harm to an unborn child. Talk to your care team for more information. Do not breast-feed an infant while taking this medication or for 1 week after stopping it. This medication may make it more difficult to get pregnant or father a child. Talk to your care team if you are concerned about your fertility. What side effects may I notice from receiving this medication? Side effects that you should report to your care team as soon as possible: Allergic reactions-skin rash, itching, hives, swelling of the face, lips, tongue, or throat Blood clot-pain, swelling, or warmth in  the leg, shortness of breath, chest pain Dry cough, shortness of breath or trouble breathing Infection-fever, chills, cough, sore throat, wounds that don't heal, pain or trouble when passing urine, general feeling of discomfort or being unwell Kidney injury-decrease in the amount of urine, swelling of the ankles, hands, or feet Liver injury-right upper belly pain, loss of appetite, nausea, light-colored stool, dark yellow or brown urine, yellowing of the skin or eyes, unusual weakness or fatigue Low red blood cell count-unusual weakness or fatigue, dizziness, headache, trouble breathing Redness, blistering, peeling, or loosening of the skin, including inside the mouth Seizures Unusual bruising or bleeding Side effects that usually do not require medical attention (report to your care team if they continue or are bothersome): Diarrhea Dizziness Hair loss Nausea Pain, redness, or swelling with sores inside the mouth or throat Vomiting This list may not describe all possible side effects. Call your doctor for medical advice about side effects. You may report side effects to FDA at 1-800-FDA-1088. Where should I keep my medication? Keep out of the reach of children and pets. Store at room temperature between 20 and 25 degrees C (68 and 77 degrees F). Protect from light. Get rid of any unused medication after the expiration date.

## 2021-03-05 ENCOUNTER — Other Ambulatory Visit: Payer: Self-pay | Admitting: Family

## 2021-03-05 NOTE — Telephone Encounter (Signed)
According to Dr. Ellouise Newer last note she was on Allegra. Please see what antihistamineshe is taking

## 2021-03-06 LAB — HEPATITIS C ANTIBODY
Hepatitis C Ab: NONREACTIVE
SIGNAL TO CUT-OFF: 0.03 (ref <1.00)

## 2021-03-06 LAB — IGG, IGA, IGM
IgG (Immunoglobin G), Serum: 765 mg/dL (ref 600–1640)
IgM, Serum: 96 mg/dL (ref 50–300)
Immunoglobulin A: 364 mg/dL — ABNORMAL HIGH (ref 47–310)

## 2021-03-06 LAB — HEPATITIS B SURFACE ANTIGEN: Hepatitis B Surface Ag: NONREACTIVE

## 2021-03-06 LAB — HEPATITIS B CORE ANTIBODY, IGM: Hep B C IgM: NONREACTIVE

## 2021-03-06 NOTE — Telephone Encounter (Signed)
Called the patient she is taking zyrtec. Allegra is not covered by her insurance. I am sending 90 tablets with no refills that way her insurance covers it. She has been having issue with her hives so she plans to keep her November office visit appointment. I  also verified her pharmacy.

## 2021-03-06 NOTE — Telephone Encounter (Signed)
Thank you :)

## 2021-03-06 NOTE — Telephone Encounter (Signed)
120 tablets due to her taking zyrtec twice a day

## 2021-03-15 ENCOUNTER — Other Ambulatory Visit: Payer: Self-pay

## 2021-03-15 ENCOUNTER — Ambulatory Visit (INDEPENDENT_AMBULATORY_CARE_PROVIDER_SITE_OTHER): Payer: BC Managed Care – PPO

## 2021-03-15 DIAGNOSIS — L501 Idiopathic urticaria: Secondary | ICD-10-CM

## 2021-03-29 ENCOUNTER — Other Ambulatory Visit: Payer: Self-pay

## 2021-03-29 ENCOUNTER — Ambulatory Visit: Payer: BC Managed Care – PPO

## 2021-03-29 ENCOUNTER — Ambulatory Visit: Payer: BC Managed Care – PPO | Admitting: Allergy & Immunology

## 2021-03-29 VITALS — BP 132/82 | HR 117 | Temp 98.2°F | Resp 16 | Ht 68.0 in | Wt 256.4 lb

## 2021-03-29 DIAGNOSIS — Z8669 Personal history of other diseases of the nervous system and sense organs: Secondary | ICD-10-CM

## 2021-03-29 DIAGNOSIS — L501 Idiopathic urticaria: Secondary | ICD-10-CM

## 2021-03-29 DIAGNOSIS — Z1589 Genetic susceptibility to other disease: Secondary | ICD-10-CM | POA: Diagnosis not present

## 2021-03-29 MED ORDER — OMALIZUMAB 150 MG/ML ~~LOC~~ SOSY
300.0000 mg | PREFILLED_SYRINGE | SUBCUTANEOUS | Status: AC
  Administered 2021-03-29 – 2021-07-05 (×8): 300 mg via SUBCUTANEOUS

## 2021-03-29 NOTE — Progress Notes (Signed)
FOLLOW UP  Date of Service/Encounter:  03/29/21   Assessment:   Chronic urticaria - on Xolair every 2 weeks with improvement in her symptoms    HLA-B27 positive - sees Dr. Vernelle Emerald (Rheumatology)   History of uveitis   Norma Carlson presents for a follow-up of her chronic autoimmune urticaria.  Overall, she is doing much better.  She recently was able to wean off of her prednisone.  Xolair every 2 weeks seems to control her symptoms very well.  Dr. Benjamine Mola was in agreement of starting a stronger immune suppressant if no improvement with the Xolair increase, but it seems that we can hold off on that right now.  She is interested in weaning off some of her medications, so came up with a regimen to help her do that slowly over the next 6 to 8 weeks.  She is going to call us with updates, otherwise we will see her in 6 months.  Plan/Recommendations:   1. Chronic urticaria - doing better on Xolair every 2 weeks - We are going to continued with the Xolair every two weeks. - We will try to wean your current medications.  - Stop the Singulair (montelukast). - Wait two weeks. - Stop the Pepcid (famotidine) - Wait two weeks. - Stop the doxepin. - Wait two weeks. - Continue with the cetirizine to 30m at noon   2. Return in about 6 months (around 09/26/2021).   Subjective:   WDANEY MOORis a 40y.o. female presenting today for follow up of  Chief Complaint  Patient presents with   Follow-up    Patient in today for a follow up for urticaria and states she is doing better less hives since last visit.    Norma GEILhas a history of the following: Patient Active Problem List   Diagnosis Date Noted   Uveitis 03/03/2021   High risk medication use 03/03/2021   Neck pain 01/31/2021   Urticaria 11/08/2020   Opioid dependence (HBenoit 04/12/2020   Other recurrent vertebral dislocation, lumbosacral region 12/18/2019   Recurrent displacement of lumbar disc 10/07/2019   Body mass  index (BMI) 36.0-36.9, adult 08/18/2019   Essential (primary) hypertension 08/18/2019   Lumbosacral radiculopathy 04/21/2019   Status post bilateral salpingectomy 12/01/2018   No pertinent past medical history     History obtained from: chart review and patient.  Norma Carlson a 40y.o. female presenting for a follow up visit.  She was last seen in 2022.  At that time, her urticaria were under poor control.  We increased her Xolair to every 14 days and continue with cetirizine 20 mg at noon.  We continue with Pepcid and Allegra twice daily as well as Singulair at night.  We also continue with doxepin 25 mg at night as well as prednisone 10 mg twice daily.  We did discuss starting cyclosporine.  Since last visit, she has done better. She is on her Xolair every 2 weeks and this has made a huge difference. She has done really well and she was taking one at night. She has not hives since stopping. She remains on her cetirizine at noon and Pepcid only at night with montelukast and doxepin.   She did see Dr. RBenjamine Molafor rheumatology.  He agreed with our plan to increase the Xolair.  He did not think there was anything autoimmune going on that needed additional work-up.  Review of his note shows that she might benefit from methotrexate.  This would help  both her urticaria and her uveitis with positive HLA-B27.  He did get some additional labs including an immunoglobulin panel as well as hepatitis B and C titers.  He also ordered a chest x-ray.  Chest x-ray was normal.  She had an elevated IgA, but was otherwise normal with regards to her immunoglobulins.  Hepatitis titers were nonreactive.  Otherwise, there have been no changes to her past medical history, surgical history, family history, or social history.    Review of Systems  Constitutional: Negative.  Negative for fever, malaise/fatigue and weight loss.  HENT: Negative.  Negative for congestion, ear discharge and ear pain.   Eyes:  Negative for pain,  discharge and redness.  Respiratory:  Negative for cough, sputum production, shortness of breath and wheezing.   Cardiovascular: Negative.  Negative for chest pain and palpitations.  Gastrointestinal:  Negative for abdominal pain, heartburn, nausea and vomiting.  Skin: Negative.  Negative for itching and rash.  Neurological:  Negative for dizziness and headaches.  Endo/Heme/Allergies:  Negative for environmental allergies. Does not bruise/bleed easily.      Objective:   Blood pressure 132/82, pulse (!) 117, temperature 98.2 F (36.8 C), temperature source Temporal, resp. rate 16, height 5' 8"  (1.727 m), weight 256 lb 6.4 oz (116.3 kg), SpO2 99 %. Body mass index is 38.99 kg/m.   Physical Exam:  Physical Exam Vitals reviewed.  Constitutional:      Appearance: She is well-developed.     Comments: Interactive and amusing.  HENT:     Head: Normocephalic and atraumatic.     Right Ear: Tympanic membrane, ear canal and external ear normal.     Left Ear: Tympanic membrane, ear canal and external ear normal.     Nose: No nasal deformity, septal deviation, mucosal edema or rhinorrhea.     Right Turbinates: Enlarged, swollen and pale.     Left Turbinates: Enlarged, swollen and pale.     Right Sinus: No maxillary sinus tenderness or frontal sinus tenderness.     Left Sinus: No maxillary sinus tenderness or frontal sinus tenderness.     Mouth/Throat:     Mouth: Mucous membranes are not pale and not dry.     Pharynx: Uvula midline.  Eyes:     General: Lids are normal. Allergic shiner present.        Right eye: No discharge.        Left eye: No discharge.     Conjunctiva/sclera: Conjunctivae normal.     Right eye: Right conjunctiva is not injected. No chemosis.    Left eye: Left conjunctiva is not injected. No chemosis.    Pupils: Pupils are equal, round, and reactive to light.  Cardiovascular:     Rate and Rhythm: Normal rate and regular rhythm.     Heart sounds: Normal heart sounds.   Pulmonary:     Effort: Pulmonary effort is normal. No tachypnea, accessory muscle usage or respiratory distress.     Breath sounds: Normal breath sounds. No wheezing, rhonchi or rales.  Chest:     Chest wall: No tenderness.  Lymphadenopathy:     Cervical: No cervical adenopathy.  Skin:    General: Skin is warm.     Capillary Refill: Capillary refill takes less than 2 seconds.     Coloration: Skin is not pale.     Findings: No abrasion, erythema, petechiae or rash. Rash is not papular, urticarial or vesicular.     Comments: She does have some excoriations present, but no overt  urticaria.  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  Neurological:     Mental Status: She is alert.  Psychiatric:        Behavior: Behavior is cooperative.     Diagnostic studies: none      Salvatore Marvel, MD  [facility] [place]

## 2021-03-29 NOTE — Patient Instructions (Addendum)
1. Chronic urticaria - doing better on Xolair every 2 weeks - We are going to continued with the Xolair every two weeks. - We will try to wean your current medications.  - Stop the Singulair (montelukast). - Wait two weeks. - Stop the Pepcid (famotidine) - Wait two weeks. - Stop the doxepin. - Wait two weeks. - Continue with the cetirizine to 20mg  at noon   2. Return in about 6 months (around 09/26/2021).    Please inform 11/26/2021 of any Emergency Department visits, hospitalizations, or changes in symptoms. Call us before going to the ED for breathing or allergy symptoms since we might be able to fit you in for a sick visit. Feel free to contact us anytime with any questions, problems, or concerns.  It was a pleasure to see you again today!  Websites that have reliable patient information: 1. American Academy of Asthma, Allergy, and Immunology: www.aaaai.org 2. Food Allergy Research and Education (FARE): foodallergy.org 3. Mothers of Asthmatics: http://www.asthmacommunitynetwork.org 4. American College of Allergy, Asthma, and Immunology: www.acaai.org   COVID-19 Vaccine Information can be found at: [place] For questions related to vaccine distribution or appointments, please email [email or web address] or call [phone].   We realize that you might be concerned about having an allergic reaction to the COVID19 vaccines. To help with that concern, WE ARE OFFERING THE COVID19 VACCINES IN OUR OFFICE! Ask the front desk for dates!     "Like" [phone] on Facebook and Instagram for our latest updates!      A healthy democracy works best when [place] participate! Make sure you are registered to vote! If you have moved or changed any of your contact information, you will need to get this updated before voting!  In some cases, you MAY be able to register to vote online:  [employer]

## 2021-04-03 ENCOUNTER — Encounter: Payer: Self-pay | Admitting: Allergy & Immunology

## 2021-04-04 ENCOUNTER — Telehealth: Payer: Self-pay | Admitting: Internal Medicine

## 2021-04-04 NOTE — Telephone Encounter (Signed)
FYI- I spoke with Ms. Schroepfer she fortunately is having a good improvement on the xolair treatment with Dr. Dellis Anes and getting off the prednisone. I reviewed her lab results IgA level is mildly elevated but not to a concerning degree, viral hepatitis screening was negative, and chest xray was completely normal. We do not need any routine follow up at this time.

## 2021-04-04 NOTE — Telephone Encounter (Signed)
Patient canceled follow up appointment due to recommendation of Allergist, Dr. Dellis Anes. Patient is being treated with injections there that has controled issues she was having. Per patient, she did not start MTX. Also, patient did not receive results of chest xray / lab results. Patient requesting a call for results. Please call to advise.

## 2021-04-05 ENCOUNTER — Ambulatory Visit: Payer: BC Managed Care – PPO | Admitting: Internal Medicine

## 2021-04-12 ENCOUNTER — Other Ambulatory Visit: Payer: Self-pay

## 2021-04-12 ENCOUNTER — Ambulatory Visit (INDEPENDENT_AMBULATORY_CARE_PROVIDER_SITE_OTHER): Payer: BC Managed Care – PPO | Admitting: *Deleted

## 2021-04-12 DIAGNOSIS — L501 Idiopathic urticaria: Secondary | ICD-10-CM

## 2021-04-26 ENCOUNTER — Other Ambulatory Visit: Payer: Self-pay

## 2021-04-26 ENCOUNTER — Ambulatory Visit (INDEPENDENT_AMBULATORY_CARE_PROVIDER_SITE_OTHER): Payer: BC Managed Care – PPO

## 2021-04-26 DIAGNOSIS — L501 Idiopathic urticaria: Secondary | ICD-10-CM | POA: Diagnosis not present

## 2021-05-10 ENCOUNTER — Ambulatory Visit (INDEPENDENT_AMBULATORY_CARE_PROVIDER_SITE_OTHER): Payer: BC Managed Care – PPO

## 2021-05-10 ENCOUNTER — Ambulatory Visit: Payer: BC Managed Care – PPO

## 2021-05-10 ENCOUNTER — Other Ambulatory Visit: Payer: Self-pay

## 2021-05-10 DIAGNOSIS — L501 Idiopathic urticaria: Secondary | ICD-10-CM | POA: Diagnosis not present

## 2021-05-24 ENCOUNTER — Ambulatory Visit (INDEPENDENT_AMBULATORY_CARE_PROVIDER_SITE_OTHER): Payer: BC Managed Care – PPO

## 2021-05-24 ENCOUNTER — Other Ambulatory Visit: Payer: Self-pay

## 2021-05-24 DIAGNOSIS — L501 Idiopathic urticaria: Secondary | ICD-10-CM | POA: Diagnosis not present

## 2021-06-09 ENCOUNTER — Ambulatory Visit (INDEPENDENT_AMBULATORY_CARE_PROVIDER_SITE_OTHER): Payer: BC Managed Care – PPO

## 2021-06-09 ENCOUNTER — Other Ambulatory Visit: Payer: Self-pay

## 2021-06-09 DIAGNOSIS — L501 Idiopathic urticaria: Secondary | ICD-10-CM

## 2021-06-21 ENCOUNTER — Other Ambulatory Visit: Payer: Self-pay

## 2021-06-21 ENCOUNTER — Ambulatory Visit (INDEPENDENT_AMBULATORY_CARE_PROVIDER_SITE_OTHER): Payer: BC Managed Care – PPO

## 2021-06-21 DIAGNOSIS — L501 Idiopathic urticaria: Secondary | ICD-10-CM | POA: Diagnosis not present

## 2021-06-22 ENCOUNTER — Other Ambulatory Visit: Payer: Self-pay | Admitting: Allergy & Immunology

## 2021-06-22 NOTE — Telephone Encounter (Signed)
Do you want these medications refilled for the patient?

## 2021-06-27 ENCOUNTER — Telehealth: Payer: Self-pay | Admitting: Allergy & Immunology

## 2021-06-27 MED ORDER — MONTELUKAST SODIUM 10 MG PO TABS: 10.0000 mg | ORAL_TABLET | Freq: Every day | ORAL | 0 refills | Status: DC

## 2021-06-27 NOTE — Telephone Encounter (Signed)
Conda would like montelukast called in to [facility] in [place].

## 2021-06-27 NOTE — Telephone Encounter (Signed)
Refill sent as pt was just seen in nov 2022 sent montelukast to [facility] in [place]

## 2021-06-27 NOTE — Telephone Encounter (Signed)
Thanks for letting us know! We will see how she does next time. She has had this multiple times without a problem, so I do not think that this is going to be a regular thing. But time will tell!   Malachi Bonds, MD [facility]

## 2021-06-27 NOTE — Telephone Encounter (Signed)
Patient called in and states at her last Xolair injection she received it in her right arm.  Patient states about 2 to 3 hours later she noticed a hive on her arm where the injection was given.  Patient states it was bigger than a half dollar.  Patient wanted to make Dr. Dellis Anes aware in case something needed to be done.

## 2021-06-30 NOTE — Telephone Encounter (Signed)
Called patient and l/m advising we had gotten message regarding her reaction and advised Dr Dellis Anes instrux and let us know if same happens again

## 2021-07-05 ENCOUNTER — Ambulatory Visit (INDEPENDENT_AMBULATORY_CARE_PROVIDER_SITE_OTHER): Payer: BC Managed Care – PPO | Admitting: *Deleted

## 2021-07-05 ENCOUNTER — Other Ambulatory Visit: Payer: Self-pay

## 2021-07-05 DIAGNOSIS — L501 Idiopathic urticaria: Secondary | ICD-10-CM

## 2021-07-10 ENCOUNTER — Encounter: Payer: Self-pay | Admitting: Allergy & Immunology

## 2021-07-11 ENCOUNTER — Encounter: Payer: Self-pay | Admitting: Allergy & Immunology

## 2021-07-19 ENCOUNTER — Ambulatory Visit: Payer: BC Managed Care – PPO

## 2021-07-19 ENCOUNTER — Encounter: Payer: Self-pay | Admitting: Allergy & Immunology

## 2021-07-19 DIAGNOSIS — L501 Idiopathic urticaria: Secondary | ICD-10-CM

## 2021-07-24 NOTE — Telephone Encounter (Signed)
After checking with [facility] it does not show a significatnt interaction found between Adipex and sulfasalazine. She can also check with her pharmacist too though.How much prednisone is she currently taking?

## 2021-07-25 NOTE — Telephone Encounter (Signed)
MyChart message sent to patient regarding Appointment with [facility].  ? ?

## 2021-07-26 NOTE — Telephone Encounter (Signed)
Per Dr. Gillermina Hu previous message:let's add on sulfasalazine 500mg  twice daily for one week and then increase to 1000mg  twice daily thereafter. This can cause some stomach discomfort, but this gets better over time. He would also like for you to start prednisone 10 mg  twice a day for 2 weeks, then 10 mg once a day for 2 weeks then stop.  Hopefully by that point and time hopefully she will have an appointment with Dr. Marcelline Deist.

## 2021-07-27 ENCOUNTER — Other Ambulatory Visit: Payer: Self-pay | Admitting: *Deleted

## 2021-07-27 MED ORDER — PREDNISONE 10 MG PO TABS: ORAL_TABLET | ORAL | 0 refills | Status: DC

## 2021-07-27 MED ORDER — SULFASALAZINE 500 MG PO TBEC
DELAYED_RELEASE_TABLET | ORAL | 1 refills | Status: DC
Start: 2021-07-27 — End: 2021-08-16

## 2021-08-16 ENCOUNTER — Other Ambulatory Visit: Payer: Self-pay | Admitting: *Deleted

## 2021-08-16 MED ORDER — PREDNISONE 10 MG PO TABS: ORAL_TABLET | ORAL | 0 refills | Status: DC

## 2021-08-16 MED ORDER — SULFASALAZINE 500 MG PO TBEC: DELAYED_RELEASE_TABLET | ORAL | 1 refills | Status: DC

## 2021-08-30 IMAGING — RF DG C-ARM 1-60 MIN
1 series · 2 of 2 positions shown · non-contrast
Comparison: Lumbar MRI September 03, 2019;

CLINICAL DATA: Posterior lumbar interbody fusion

EXAM:
LUMBAR SPINE - 2-3 VIEW; DG C-ARM 1-60 MIN

[Series 1: run · 2 of 2 slices shown]
[im 1/2]
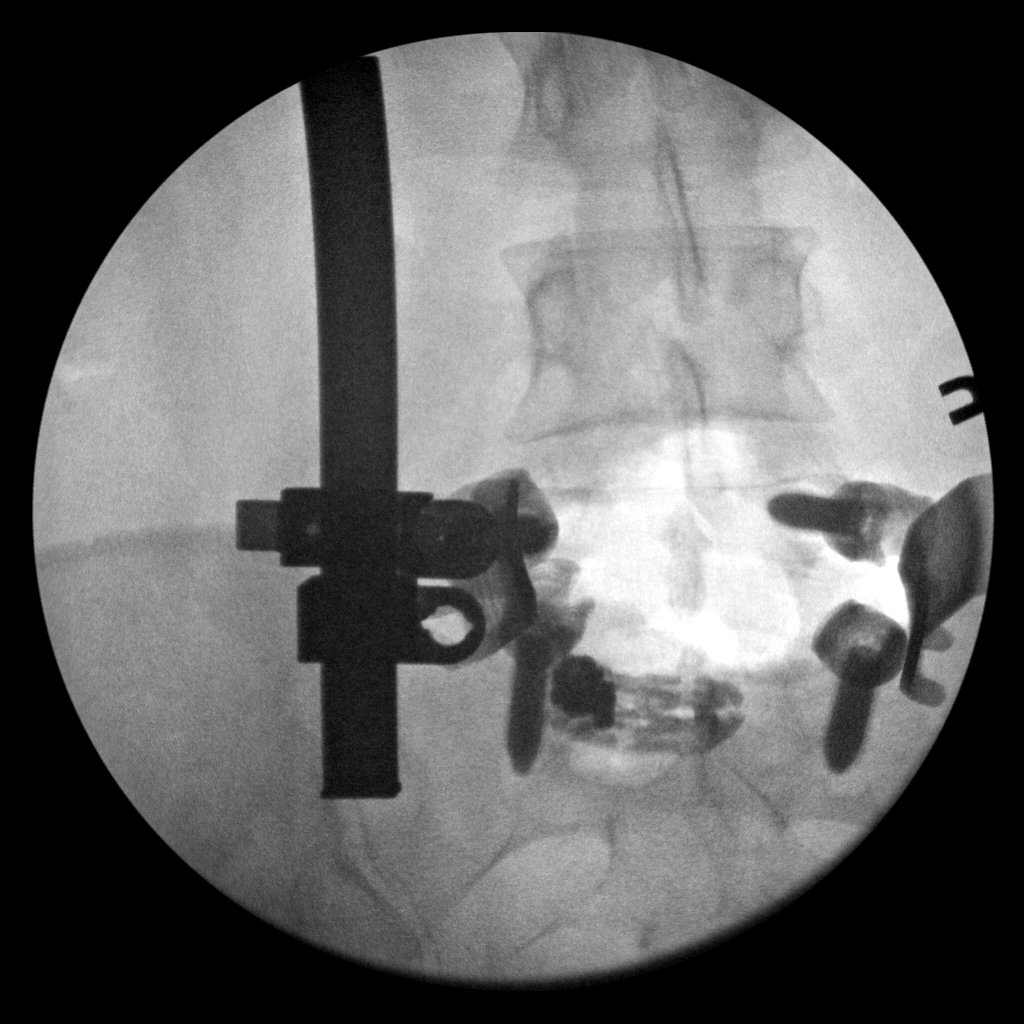
[im 2/2]
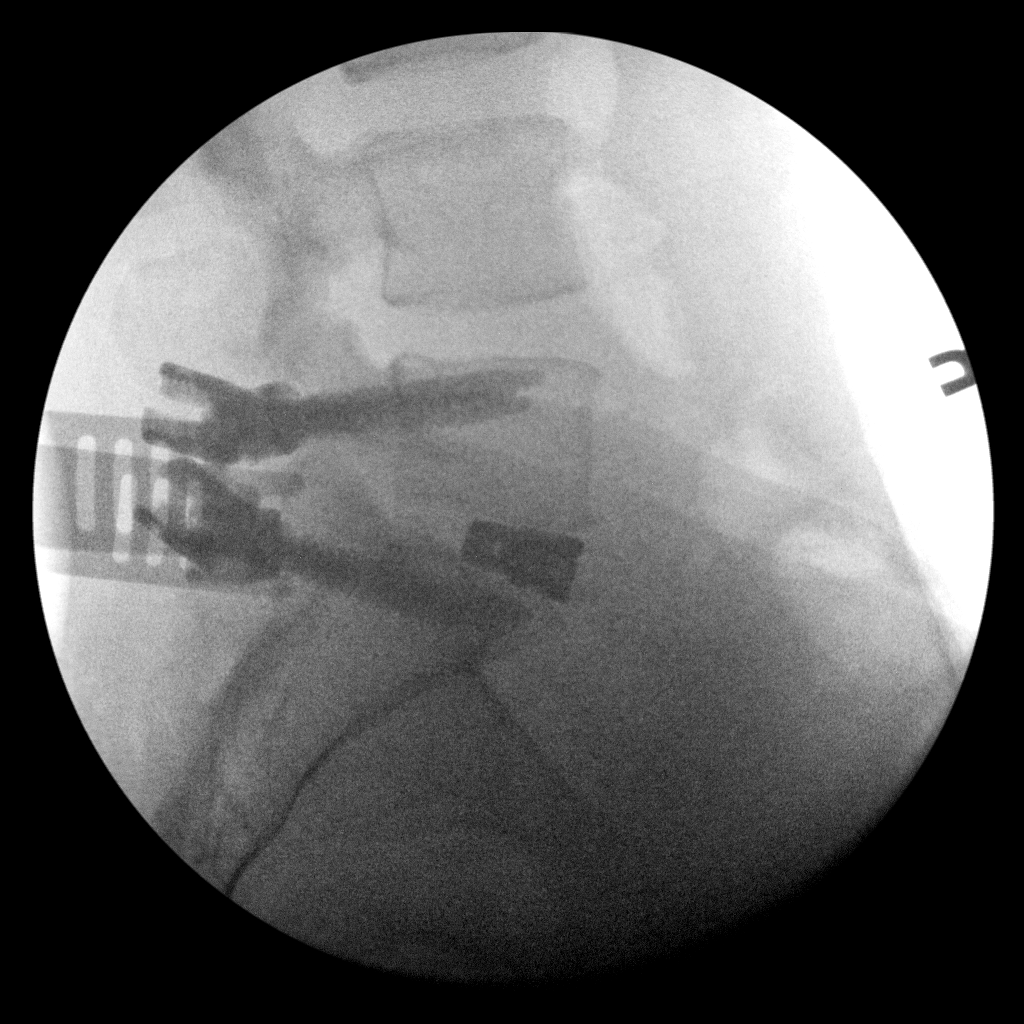

[2 of 2 positions shown; findings below may reference images not displayed]

lateral lumbar spine from OR

FLUOROSCOPY TIME:  0 minutes 18 seconds; 2 acquired images
FINDINGS: Frontal and lateral views show pedicle screw fixation at L5 and S1
with pedicle screw tips in the respective vertebral bodies. Disc
spacer is present at L5-S1. No fracture or spondylolisthesis evident
in visualized lumbar spine. There remains mild disc space narrowing
at L5-S1.
IMPRESSION: Postoperative pedicle screw fixation at L5 and S1 with pedicle screw
tips in respective vertebral bodies. Disc spacer at L5-S1. No
fracture or spondylolisthesis evident and visualized lower lumbar
regions.

## 2021-09-27 ENCOUNTER — Ambulatory Visit: Payer: BC Managed Care – PPO | Admitting: Allergy & Immunology

## 2021-09-27 DIAGNOSIS — J309 Allergic rhinitis, unspecified: Secondary | ICD-10-CM

## 2021-10-05 ENCOUNTER — Other Ambulatory Visit: Payer: Self-pay

## 2021-10-05 MED ORDER — FAMOTIDINE 20 MG PO TABS: 20.0000 mg | ORAL_TABLET | Freq: Two times a day (BID) | ORAL | 0 refills | Status: DC

## 2022-01-19 ENCOUNTER — Other Ambulatory Visit: Payer: Self-pay | Admitting: *Deleted

## 2022-01-19 MED ORDER — FAMOTIDINE 20 MG PO TABS: 20.0000 mg | ORAL_TABLET | Freq: Two times a day (BID) | ORAL | 0 refills | Status: DC

## 2022-07-09 ENCOUNTER — Telehealth: Payer: Self-pay

## 2022-07-09 NOTE — Telephone Encounter (Signed)
Totally reasonable.   Salvatore Marvel, MD [facility] [place] [facility] [place]

## 2022-07-09 NOTE — Telephone Encounter (Signed)
Called and spoke patient to inquire if she still wanted to proceed with her Xolair injections. Patient expressed that after fourteen months of being on the Xolair the hives went away there she has no interest of continuing immunotherapy or coming back as she feels things have resolved. I have sent the [employer] discontinuation form and sent it to her home and will wait for her to send it back to our office.

## 2022-07-09 NOTE — Telephone Encounter (Signed)
Already have her inactive

## 2022-08-01 NOTE — Telephone Encounter (Signed)
Patient mailed back signed discontinuation form for her Xolair. I have placed it in bulk scanning.

## 2022-09-19 ENCOUNTER — Other Ambulatory Visit: Payer: Self-pay | Admitting: Neurosurgery

## 2022-09-19 DIAGNOSIS — Z981 Arthrodesis status: Secondary | ICD-10-CM

## 2022-10-27 ENCOUNTER — Other Ambulatory Visit: Payer: BC Managed Care – PPO

## 2023-01-25 IMAGING — CR DG CHEST 2V
2 series · 2 of 2 positions shown · non-contrast
Comparison: None

CLINICAL DATA: Pre methotrexate therapy

EXAM:
CHEST - 2 VIEW

[w chest pa]
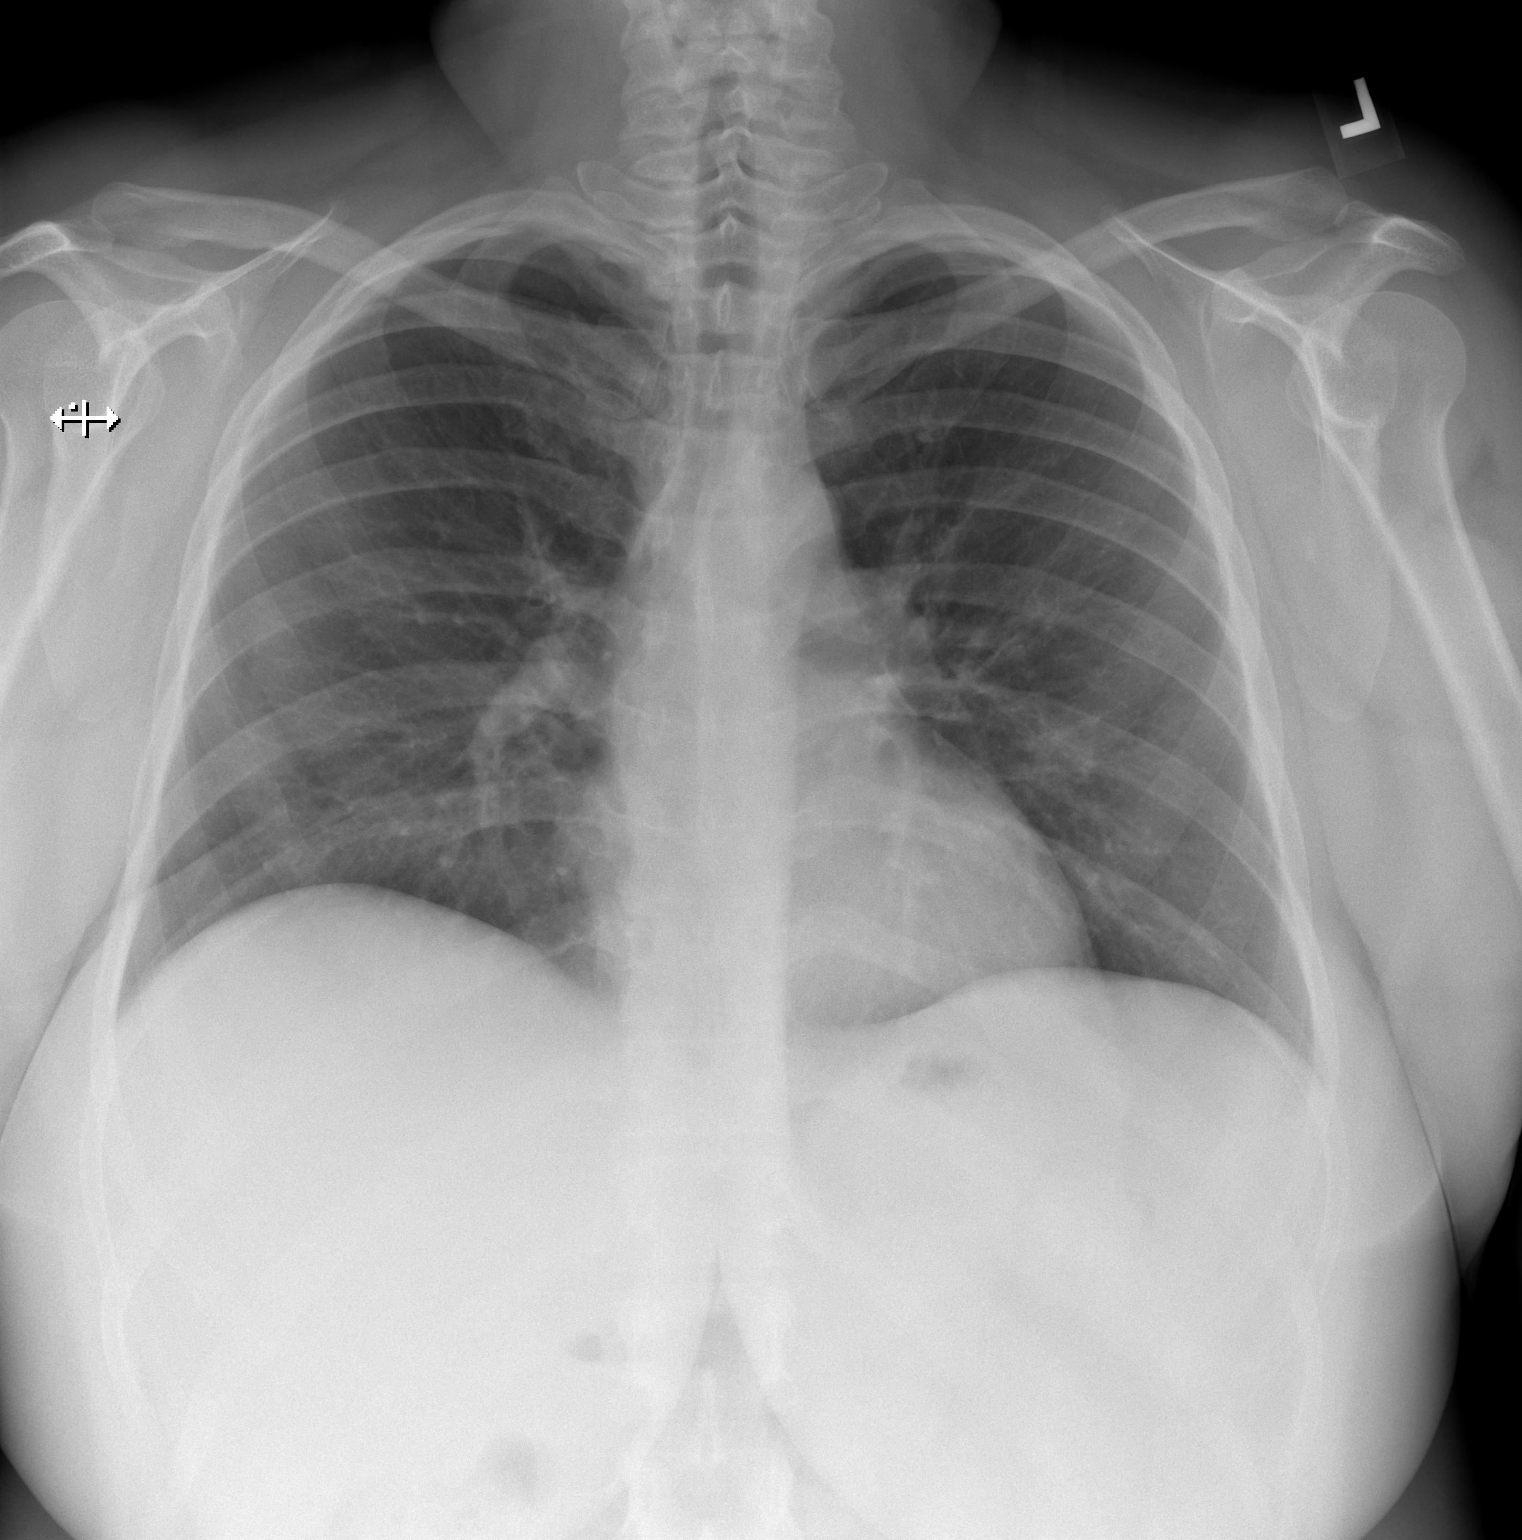

[w chest lat]
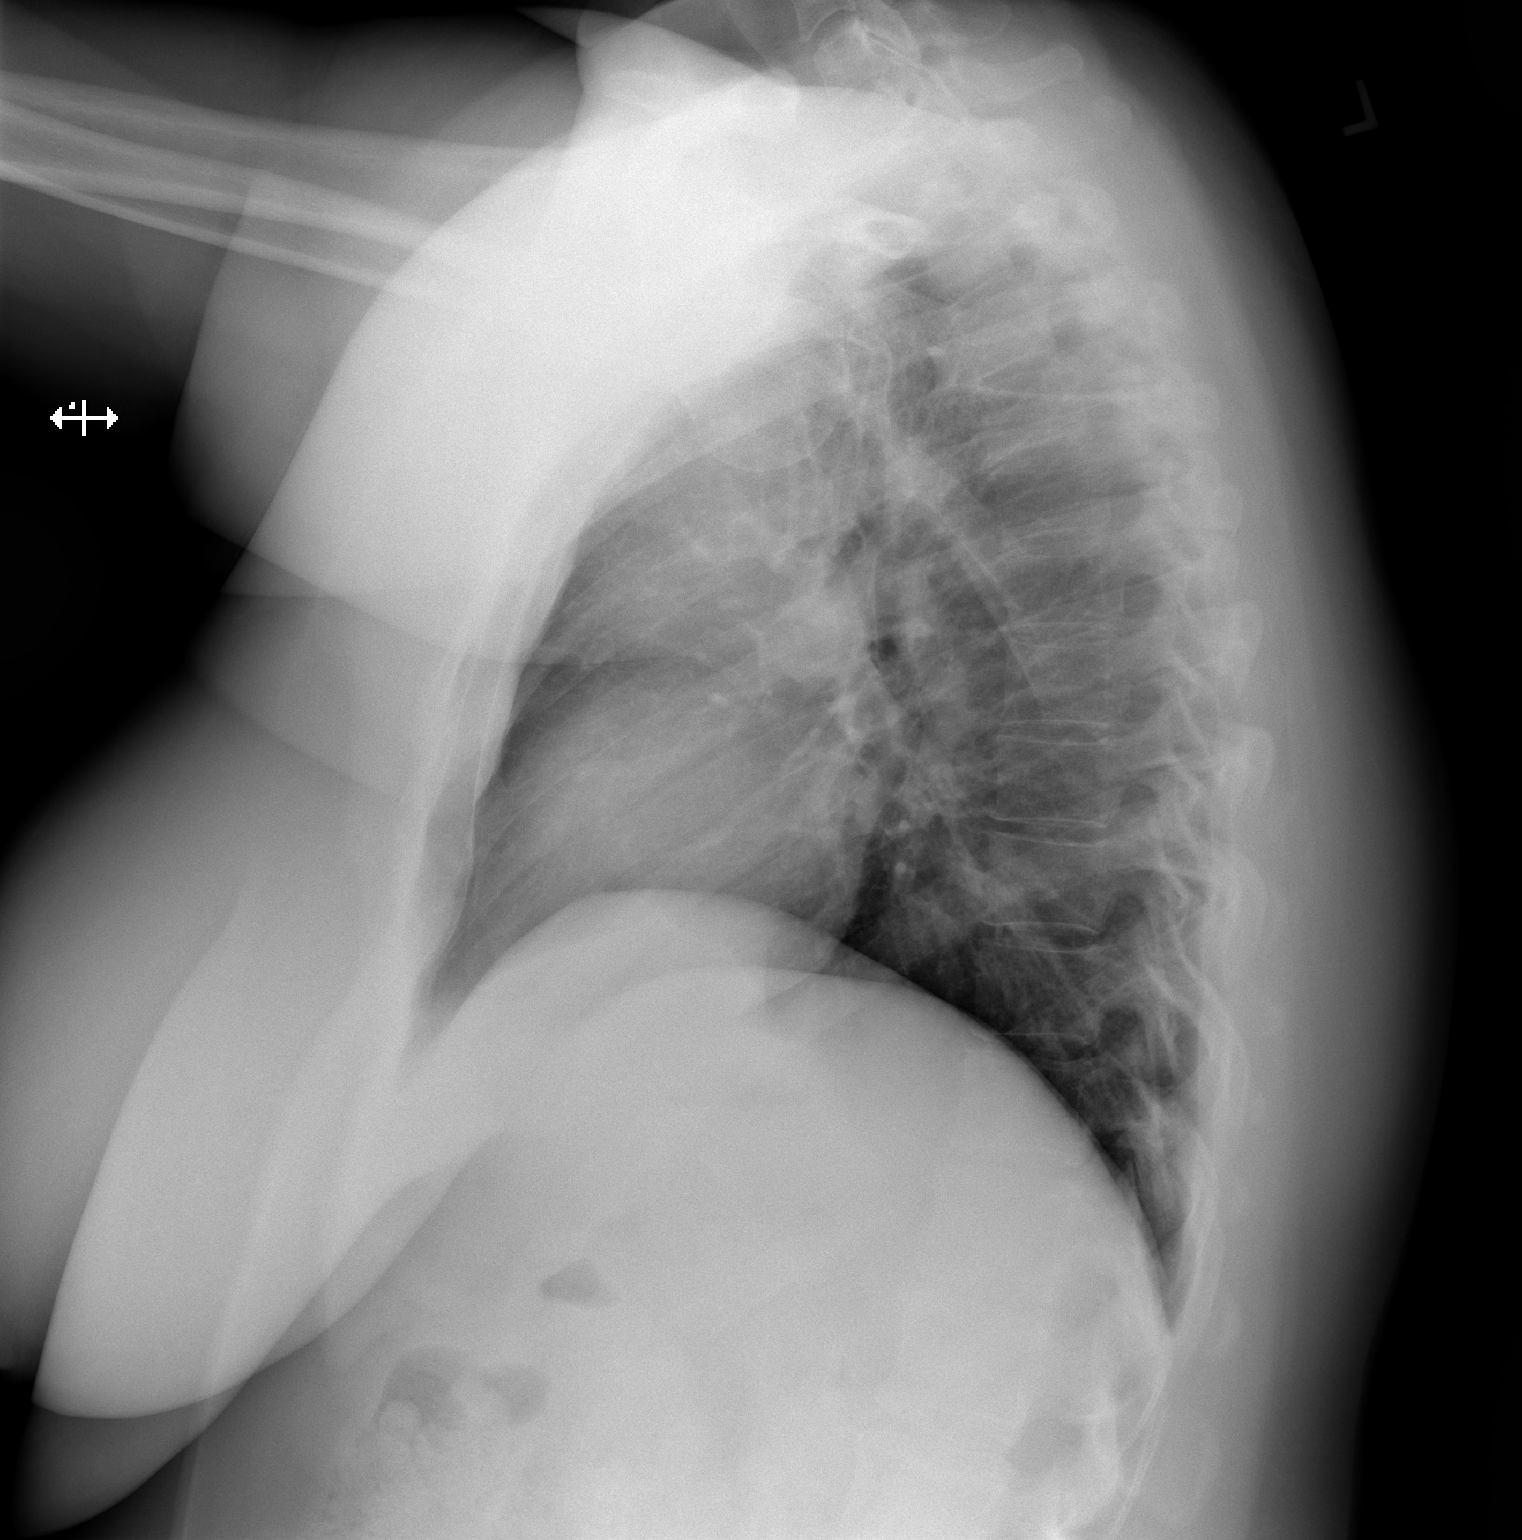

[2 of 2 positions shown; findings below may reference images not displayed]

FINDINGS: Normal heart size, mediastinal contours, and pulmonary vascularity.

Lungs clear.

No pleural effusion or pneumothorax.

Bones unremarkable.
IMPRESSION: Normal exam.

## 2023-11-25 ENCOUNTER — Ambulatory Visit: Admitting: Allergy & Immunology

## 2023-11-25 ENCOUNTER — Other Ambulatory Visit: Payer: Self-pay

## 2023-11-25 ENCOUNTER — Encounter: Payer: Self-pay | Admitting: Allergy & Immunology

## 2023-11-25 VITALS — BP 130/80 | HR 100 | Temp 97.2°F | Resp 18 | Ht 66.93 in | Wt 224.8 lb

## 2023-11-25 DIAGNOSIS — Z1589 Genetic susceptibility to other disease: Secondary | ICD-10-CM

## 2023-11-25 DIAGNOSIS — L508 Other urticaria: Secondary | ICD-10-CM

## 2023-11-25 MED ORDER — DUPILUMAB 300 MG/2ML ~~LOC~~ SOSY
300.0000 mg | PREFILLED_SYRINGE | SUBCUTANEOUS | Status: AC
  Administered 2023-11-25: 300 mg via SUBCUTANEOUS

## 2023-11-25 MED ORDER — TRIAMCINOLONE ACETONIDE 0.1 % EX OINT: 1.0000 | TOPICAL_OINTMENT | Freq: Two times a day (BID) | CUTANEOUS | 0 refills | Status: AC

## 2023-11-25 MED ORDER — FAMOTIDINE 20 MG PO TABS: 20.0000 mg | ORAL_TABLET | Freq: Two times a day (BID) | ORAL | 1 refills | Status: AC

## 2023-11-25 NOTE — Progress Notes (Signed)
 FOLLOW UP  Date of Service/Encounter:  11/25/23   Assessment:   Chronic urticaria - resolved with cyclosporine and was stable for 18 months before returning again   HLA-B27 positive - saw Dr. Lonni Ester (Rheumatology)   History of uveitis   Plan/Recommendations:   1. Chronic urticaria -  unresponsive to Xolair  in the past - We are going to start Dupixent  to see if that helps. - Continue with the suppressive antihistamine dosing:  - AM: Zyrtec  20 mg + Pepcid  20mg  - NOON: Allegra 1-2 tablets  - PM: Zyrtec  20 mg + Pepcid  20mg   - We may need to consider the addition of the Btk inhibitors when they are approved this fall. - Add on triamcinolone  ointment twice daily (AVOID THE FACE).   2. Return in about 4 weeks (around 12/23/2023). You can have the follow up appointment with Dr. Iva or a Nurse Practicioner (our Nurse Practitioners are excellent and always have Physician oversight!).   Subjective:   Norma Carlson is a 43 y.o. female presenting today for follow up of  Chief Complaint  Patient presents with   Urticaria    Experiencing severe hives been having chills and weak feeling pt would like to start taking dupixient    Courtny H Dockstader has a history of the following: Patient Active Problem List   Diagnosis Date Noted   Uveitis 03/03/2021   High risk medication use 03/03/2021   Neck pain 01/31/2021   Urticaria 11/08/2020   Opioid dependence (HCC) 04/12/2020   Other recurrent vertebral dislocation, lumbosacral region 12/18/2019   Recurrent displacement of lumbar disc 10/07/2019   Body mass index (BMI) 36.0-36.9, adult 08/18/2019   Essential (primary) hypertension 08/18/2019   Lumbosacral radiculopathy 04/21/2019   Status post bilateral salpingectomy 12/01/2018   No pertinent past medical history     History obtained from: chart review and patient.  Discussed the use of AI scribe software for clinical note transcription with the patient and/or  guardian, who gave verbal consent to proceed.  Norma Carlson is a 43 y.o. female presenting for a follow up visit.  She was last seen in November 2022.  At that time, we continue with Xolair  every 2 weeks.  Recommended that she wean her Singulair , Pepcid , and doxepin .  We did refer her to see allergy at [facility].  She saw Dr. Vincente on April 21.  At that time, she had her Singulair  stopped.  They started cyclosporine 50 mg twice daily.  I talked about increasing the cyclosporine more if needed.  In the interim, she did well fro around 18 months. She has a history of hives and was previously treated with cyclosporine, which led to improvement and resolution of symptoms for almost two years. She has been off all antihistamines and other medications during this period until the recent recurrence.  Recurrent hives began approximately four weeks ago, described as worse than previous episodes. Symptoms include chest tightness and loss of voice occurring every weekend, along with fever-like symptoms, chills, and weakness, although she does not feel sick otherwise.  Currently, she is taking loratadine , cetirizine , and Xyzal daily, along with famotidine  twice a day. She has also tried some leftover sulfasalazine  without success. Despite these treatments, the hives persist, particularly at night, starting around 8 PM and peaking by 2 AM, then subsiding during the day.  She has previously been on Xolair  every two weeks, which did not provide relief. She has also tried Singulair  and doxepin  without significant improvement. She has  a history of weight gain with prednisone  and is not willing to use it again.  The hives primarily affect the back of her legs, arms, and occasionally her face, which is associated with chest tightness. She uses an inhaler as needed for chest symptoms. She has residual scarring from scratching, particularly at night.  Family history reveals that her son experienced hives once,  about ten years ago, but has not had any issues since. She has no pets that could be contributing to the symptoms, although she has had cats her entire life.  Otherwise, there have been no changes to her past medical history, surgical history, family history, or social history.    Review of systems otherwise negative other than that mentioned in the HPI.    Objective:   Blood pressure 130/80, pulse 100, temperature (!) 97.2 F (36.2 C), temperature source Temporal, resp. rate 18, height 5' 6.93 (1.7 m), weight 224 lb 12.8 oz (102 kg), SpO2 96%. Body mass index is 35.28 kg/m.    Physical Exam Constitutional:      Appearance: She is well-developed.  HENT:     Head: Normocephalic and atraumatic.     Right Ear: Tympanic membrane, ear canal and external ear normal.     Left Ear: Tympanic membrane, ear canal and external ear normal.     Nose: No nasal deformity, septal deviation, mucosal edema or rhinorrhea.     Right Turbinates: Not enlarged or swollen.     Left Turbinates: Not enlarged or swollen.     Right Sinus: No maxillary sinus tenderness or frontal sinus tenderness.     Left Sinus: No maxillary sinus tenderness or frontal sinus tenderness.     Mouth/Throat:     Mouth: Mucous membranes are not pale and not dry.     Pharynx: Uvula midline.  Eyes:     General: Lids are normal. No allergic shiner.       Right eye: No discharge.        Left eye: No discharge.     Conjunctiva/sclera: Conjunctivae normal.     Right eye: Right conjunctiva is not injected. No chemosis.    Left eye: Left conjunctiva is not injected. No chemosis.    Pupils: Pupils are equal, round, and reactive to light.  Cardiovascular:     Rate and Rhythm: Normal rate and regular rhythm.     Heart sounds: Normal heart sounds.  Pulmonary:     Effort: Pulmonary effort is normal. No tachypnea, accessory muscle usage or respiratory distress.     Breath sounds: Normal breath sounds. No wheezing, rhonchi or rales.   Chest:     Chest wall: No tenderness.  Lymphadenopathy:     Cervical: No cervical adenopathy.  Skin:    Coloration: Skin is not pale.     Findings: Rash present. No abrasion, erythema or petechiae. Rash is urticarial. Rash is not papular or vesicular.     Comments: Urticarial rashs on her bilateral arms and legs.  Neurological:     Mental Status: She is alert.  Psychiatric:        Behavior: Behavior is cooperative.      Diagnostic studies: none  She did receive a loading dose of Dupixent .  She was monitored for 15 minutes and did well.      Marty Shaggy, MD  [facility] 

## 2023-11-25 NOTE — Progress Notes (Signed)
 Immunotherapy   Patient Details  Name: Norma Carlson MRN: [id number] Date of Birth: 14-May-1981  11/25/2023  [name] started Dupixent  injections for Chronic Urticaria   Frequency: Every 2 weeks Consent signed and patient instructions given. The patient waited 15 minutes in office after injection administration and did not have issues.    Norma Carlson 11/25/2023, 12:03 PM

## 2023-11-25 NOTE — Patient Instructions (Addendum)
 1. Chronic urticaria -  unresponsive to Xolair  in the past - We are going to start Dupixent  to see if that helps. - Continue with the suppressive antihistamine dosing:  - AM: Zyrtec  20 mg + Pepcid  20mg  - NOON: Allegra 1-2 tablets  - PM: Zyrtec  20 mg + Pepcid  20mg   - We may need to consider the addition of the Btk inhibitors when they are approved this fall. - Add on triamcinolone  ointment twice daily (AVOID THE FACE).   2. Return in about 4 weeks (around 12/23/2023). You can have the follow up appointment with Dr. Iva or a Nurse Practicioner (our Nurse Practitioners are excellent and always have Physician oversight!).    Please inform us  of any Emergency Department visits, hospitalizations, or changes in symptoms. Call us  before going to the ED for breathing or allergy symptoms since we might be able to fit you in for a sick visit. Feel free to contact us  anytime with any questions, problems, or concerns.  It was a pleasure to see you again today!  Websites that have reliable patient information: 1. American Academy of Asthma, Allergy, and Immunology: www.aaaai.org 2. Food Allergy Research and Education (FARE): foodallergy.org 3. Mothers of Asthmatics: http://www.asthmacommunitynetwork.org 4. American College of Allergy, Asthma, and Immunology: www.acaai.org      "Like" us  on Facebook and Instagram for our latest updates!      A healthy democracy works best when [employer] participate! Make sure you are registered to vote! If you have moved or changed any of your contact information, you will need to get this updated before voting! Scan the QR codes below to learn more!

## 2023-11-26 ENCOUNTER — Telehealth: Payer: Self-pay | Admitting: *Deleted

## 2023-11-26 MED ORDER — DUPIXENT 300 MG/2ML ~~LOC~~ SOSY: 300.0000 mg | PREFILLED_SYRINGE | SUBCUTANEOUS | 11 refills | Status: DC

## 2023-11-26 NOTE — Telephone Encounter (Signed)
 Called patient and advised approval, copay card and submit to [facility]. She will start off with injs in clinic then may go to home admin by friend

## 2023-11-26 NOTE — Telephone Encounter (Signed)
-----   Message from Marty Morton Shaggy sent at 11/25/2023 12:45 PM EDT ----- Norma Carlson  loading dose. I think she is going to make a good candidate for that Btk inhibitor.

## 2023-11-27 ENCOUNTER — Other Ambulatory Visit (HOSPITAL_COMMUNITY): Payer: Self-pay

## 2023-11-28 ENCOUNTER — Encounter: Payer: Self-pay | Admitting: Allergy & Immunology

## 2023-11-29 MED ORDER — PREDNISONE 10 MG PO TABS: ORAL_TABLET | ORAL | 0 refills | Status: DC

## 2023-12-02 NOTE — Addendum Note (Signed)
 Addended by: OTHA MADELIN HERO on: 12/02/2023 09:02 AM   Modules accepted: Orders

## 2023-12-04 ENCOUNTER — Ambulatory Visit: Attending: Internal Medicine | Admitting: Internal Medicine

## 2023-12-04 ENCOUNTER — Encounter: Payer: Self-pay | Admitting: Internal Medicine

## 2023-12-04 VITALS — BP 133/81 | HR 121 | Resp 14 | Ht 67.75 in | Wt 221.0 lb

## 2023-12-04 DIAGNOSIS — M7989 Other specified soft tissue disorders: Secondary | ICD-10-CM | POA: Insufficient documentation

## 2023-12-04 DIAGNOSIS — H209 Unspecified iridocyclitis: Secondary | ICD-10-CM | POA: Diagnosis not present

## 2023-12-04 DIAGNOSIS — L509 Urticaria, unspecified: Secondary | ICD-10-CM

## 2023-12-04 MED ORDER — PREDNISONE 10 MG PO TABS: ORAL_TABLET | ORAL | 0 refills | Status: DC

## 2023-12-04 NOTE — Progress Notes (Signed)
 Office Visit Note  Patient: Norma Carlson             Date of Birth: 09/17/1980           MRN: [id number]             PCP: Skillman, Katherine E, PA-C Referring: Skillman, Katherine E, * Visit Date: 12/04/2023   Subjective:  Follow-up (Abnormal labs on 12/02/2023. Increased joint pain and joint swelling. Chronic hives returned after being gone for 18 months. )    Discussed the use of AI scribe software for clinical note transcription with the patient, who gave verbal consent to proceed.  History of Present Illness   Norma Carlson is a 43 year old female previously seen for chronic hives and episodic uveitis in 2022 who presents with recurrent hives and painful joint pain.  After last visit she never started any maintenance DMARDs drug but saw resolution of the hives.  Symptoms remained improved off any specific treatment for about 18 months.  She has been experiencing a recurrence of hives for the past five to six weeks. The hives are itchy and hot, appearing all over her body, and tend to worsen at night and in the morning. Individual rash areas last about a day before new ones appear. The hives leave behind bruising. She did not experience this bruising with previous episodes. She has been taking daily pictures to track the progression of the rashes.  Joint pain began on Saturday night 4 days ago, initially affecting her ankle, then her hands, and now her knees. The joint pain is accompanied by swelling, particularly in her hands, making it difficult to wear rings or grasp objects. The pain is severe enough to cause her to cry and prevent her from rolling over in bed.  Her past medical history includes episodic uveitis, with the last episode occurring in March 2022, requiring intravitreal steroid injection.  She has been on various treatments for her hives, including Xolair , which was ineffective, and cyclosporine, which she used until her symptoms improved over a year ago.  Recently, she started Dupixent , receiving her first injection one to two weeks ago. She began prednisone  yesterday at 30 mg twice daily for her current symptoms but has not noticed significant improvement yet. She previously gained weight while on long-term prednisone  treatment but otherwise tolerated okay.  No recent viral illnesses or upper respiratory infections preceding the current episode, although she typically experiences two sinus infections annually. She reports occasional palpitations and a fast heart rate, which have worsened with her current symptoms.     Labs reviewed 12/02/23 ESR 35 CCP neg RF neg ANA neg Uric acid 3.7  Previous HPI 03/03/21 Norma Carlson is a 43 y.o. female here for evaluation with positive HLA-B27 allele and history of uveitis with joint pains and urticarial rashes. Starting in April this year she has had ongoing rashes diagnosed as chronic idiopathic urticaria, fairly refractory to treatments with ongoing allergy management including cetirizine , famotidine , singulair , xolair  and recurrent prednisone  treatments. She was originally diagnosed with uveitis in high school and sees her optometrist in [place] with intermittent intraocular steroid injections for management, last March 2022. However for decades there have been no extraocular symptoms or complications, and never been on treatment outside of as needed steroids. In addition to the skin rashes she has experienced multiple symptoms including diffuse skin flushing and tachycardia episodes. She denies any loss of consciousness or frequent palpitations. She has had some weight gain with the  recurrent prednisone  treatments during this year.   Labs reviewed 10/2020 ANA neg Complement C3 C4 wnl ESR 16 CRP 15 CBC wnl CMP unremarkable   Review of Systems  Constitutional:  Positive for fatigue.  HENT:  Negative for mouth sores and mouth dryness.   Eyes:  Negative for dryness.  Respiratory:  Positive for  shortness of breath.   Cardiovascular:  Positive for chest pain and palpitations.  Gastrointestinal:  Positive for constipation and diarrhea. Negative for blood in stool.  Endocrine: Negative for increased urination.  Genitourinary:  Negative for involuntary urination.  Musculoskeletal:  Positive for joint pain, joint pain, joint swelling, myalgias, muscle weakness, morning stiffness and myalgias. Negative for gait problem and muscle tenderness.  Skin:  Positive for rash. Negative for color change, hair loss and sensitivity to sunlight.  Allergic/Immunologic: Negative for susceptible to infections.  Neurological:  Positive for light-headedness. Negative for dizziness and headaches.  Hematological:  Positive for swollen glands.  Psychiatric/Behavioral:  Positive for depressed mood and sleep disturbance. The patient is nervous/anxious.     PMFS History:  Patient Active Problem List   Diagnosis Date Noted   Leg swelling 12/04/2023   Uveitis 03/03/2021   High risk medication use 03/03/2021   Neck pain 01/31/2021   Urticaria 11/08/2020   Opioid dependence (HCC) 04/12/2020   Other recurrent vertebral dislocation, lumbosacral region 12/18/2019   Recurrent displacement of lumbar disc 10/07/2019   Body mass index (BMI) 36.0-36.9, adult 08/18/2019   Essential (primary) hypertension 08/18/2019   Lumbosacral radiculopathy 04/21/2019   Status post bilateral salpingectomy 12/01/2018   No pertinent past medical history     Past Medical History:  Diagnosis Date   Anxiety    H/O seasonal allergies    No pertinent past medical history    Pneumonia    Recurrent herniation of lumbar disc    Sleep apnea    wears CPAP   Urticaria    Wears glasses    and contacts    Family History  Problem Relation Age of Onset   HLA-B27 positive Paternal Uncle    Uveitis Paternal Uncle    Past Surgical History:  Procedure Laterality Date   ABDOMINAL HYSTERECTOMY     BACK SURGERY     04/2019  microdiscectomy, 09/2019 lumbar fusion   BACK SURGERY  2021   LEEP  2004   TYMPANOSTOMY TUBE PLACEMENT     WISDOM TOOTH EXTRACTION     Social History   Social History Narrative   Not on file   Immunization History  Administered Date(s) Administered   Influenza Split 04/07/2011   Influenza-Unspecified 02/18/2018, 02/19/2019   Tdap 04/07/2011     Objective: Vital Signs: BP 133/81 (BP Location: Left Arm, Patient Position: Sitting, Cuff Size: Large)   Pulse (!) 121   Resp 14   Ht 5' 7.75 (1.721 m)   Wt 221 lb (100.2 kg)   BMI 33.85 kg/m    Physical Exam Constitutional:      Appearance: She is obese.  HENT:     Nose: Nose normal.     Mouth/Throat:     Mouth: Mucous membranes are moist.     Pharynx: Oropharynx is clear.  Eyes:     Conjunctiva/sclera: Conjunctivae normal.  Cardiovascular:     Rate and Rhythm: Normal rate and regular rhythm.  Pulmonary:     Effort: Pulmonary effort is normal.     Breath sounds: Normal breath sounds.  Musculoskeletal:     Right lower leg: No edema.  Left lower leg: No edema.  Lymphadenopathy:     Cervical: No cervical adenopathy.  Skin:    General: Skin is warm and dry.     Findings: Rash present.     Comments: Diffuse blanching erythematous hives on face, upper and lower extremities, and on torso Small bruises present on extremities most on the left upper arm and right lower leg No fingernail pitting or lesions Normal-appearing nailfold capillaroscopy  Neurological:     Mental Status: She is alert.  Psychiatric:        Mood and Affect: Mood normal.      Musculoskeletal Exam:  Shoulders full ROM no tenderness or swelling Elbows full ROM no tenderness or swelling Wrists full ROM no tenderness or swelling Fingers full ROM no tenderness or swelling No paraspinal tenderness to palpation over upper and lower back Hip normal internal and external rotation without pain, no tenderness to lateral hip palpation Knees full ROM no  tenderness or swelling Ankles full ROM no tenderness or swelling   Investigation: No additional findings.  Imaging: No results found.  Recent Labs: Lab Results  Component Value Date   WBC 6.4 11/08/2020   HGB 13.0 11/08/2020   PLT 339 11/08/2020   NA 141 11/08/2020   K 3.9 11/08/2020   CL 104 11/08/2020   CO2 19 (L) 11/08/2020   GLUCOSE 87 11/08/2020   BUN 16 11/08/2020   CREATININE 0.72 11/08/2020   BILITOT <0.2 11/08/2020   ALKPHOS 106 11/08/2020   AST 18 11/08/2020   ALT 17 11/08/2020   PROT 6.6 11/08/2020   ALBUMIN 4.3 11/08/2020   CALCIUM 9.0 11/08/2020   GFRAA >60 10/08/2019    Speciality Comments: No specialty comments available.  Procedures:  No procedures performed Allergies: Fluoxetine and Fluoxetine hcl   Assessment / Plan:     Visit Diagnoses: Urticaria - Plan: C-reactive protein, C3 and C4, Anti-DNA antibody, double-stranded Persistent daily hives with itching and hot sensation. Xolair  and oral antihistamines previously ineffective, Dupixent  started with no significant improvement but just began. Considering methotrexate or azathioprine DMARD treatments due to uveitis history and possible autoimmune etiology but waiting to see initial response with steroids and injectable. Bruising in areas of hives could be consistent with urticarial vasculitis but no other red flags. Continuing Dupixent  with allergy clinic. - Agree with current prednisone  taper, on day 2 of 9 - checking CRP, complement, dsDNA - Discussed information on DMRADs, prednisone  use and side effects. She had recent labs including CBC and CMP were normal  Joint pain and swelling Acute joint pain and swelling in ankle, hands, and knees. No recent illness. Symptoms atypical of previous episodes. Severe pain reported but not very much reproducible on physical exam now. Considering autoimmune etiology due to uveitis and urticaria history.   Uveitis No episodes since March 2022. Previous episodes  required ocular injections. Considering methotrexate for autoimmune treatment.    Orders: Orders Placed This Encounter  Procedures   C-reactive protein   C3 and C4   Anti-DNA antibody, double-stranded   No orders of the defined types were placed in this encounter.    Follow-Up Instructions: No follow-ups on file.   Lonni LELON Ester, MD  Note - This record has been created using [employer].  Chart creation errors have been sought, but may not always  have been located. Such creation errors do not reflect on  the standard of medical care.

## 2023-12-04 NOTE — Addendum Note (Signed)
 Addended by: IVA MARTY SALTNESS on: 12/04/2023 04:57 PM   Modules accepted: Orders

## 2023-12-05 ENCOUNTER — Encounter: Payer: Self-pay | Admitting: Internal Medicine

## 2023-12-06 LAB — ANTI-DNA ANTIBODY, DOUBLE-STRANDED: ds DNA Ab: 1 [IU]/mL

## 2023-12-06 LAB — C-REACTIVE PROTEIN: CRP: 19.1 mg/L — ABNORMAL HIGH (ref <8.0)

## 2023-12-06 LAB — C3 AND C4
C3 Complement: 151 mg/dL (ref 83–193)
C4 Complement: 17 mg/dL (ref 15–57)

## 2023-12-12 ENCOUNTER — Ambulatory Visit: Admitting: Internal Medicine

## 2024-01-01 ENCOUNTER — Ambulatory Visit: Admitting: Family Medicine

## 2024-01-01 VITALS — BP 110/80 | HR 114 | Temp 98.2°F | Wt 220.5 lb

## 2024-01-01 DIAGNOSIS — L508 Other urticaria: Secondary | ICD-10-CM | POA: Diagnosis not present

## 2024-01-01 NOTE — Patient Instructions (Addendum)
 Chronic urticaria Continue Dupixent  injections 300 mg once every 2 weeks for control of hives Take the least amount of medications while remaining hive free Cetirizine  (Zyrtec ) 10mg  twice a day and famotidine  (Pepcid ) 20 mg twice a day. If no symptoms for 7-14 days then decrease to. Cetirizine  (Zyrtec ) 10mg  twice a day and famotidine  (Pepcid ) 20 mg once a day.  If no symptoms for 7-14 days then decrease to. Cetirizine  (Zyrtec ) 10mg  twice a day.  If no symptoms for 7-14 days then decrease to. Cetirizine  (Zyrtec ) 10mg  once a day.  May use Allegra as needed for breakthrough hives       If symptoms return, then step up dosage Continue triamcinolone  to red or itchy areas underneath your face up to twice a day if needed.  Do not use this medication longer than 2 weeks in a row Keep a detailed symptom journal including foods eaten, contact with allergens, medications taken, weather changes.  URI Keep notes of joint pain or any changes to your eyes.  Call the clinic if this treatment plan is not working well for you.  Follow up in 6 months or sooner if needed.

## 2024-01-01 NOTE — Progress Notes (Signed)
 [address] [place] [address] Dept: [phone]  FOLLOW UP NOTE  Patient ID: Norma Carlson, female    DOB: 03-08-1981  Age: 43 y.o. MRN: [id number] Date of Office Visit: 01/01/2024  Assessment  Chief Complaint: Follow-up  HPI Norma Carlson is a 43 year old female who presents to the clinic for a follow-up visit.  She was last seen in this clinic on 11/28/2023 by Dr. Iva for evaluation of chronic urticaria.  She has a longstanding history with recurring episodes of chronic urticaria for which she has previously been on Xolair , cyclosporine, Singulair , Pepcid , Doxy cyclin, and Allegra.  Her last episode of hives began between the end of June and the first part of July.  She received a loading dose of Dupixent  on 11/25/2023 and continues at home Dupixent  administration once every 2 weeks.  She does report an episode of excruciating joint pain lasting for approximately 4 days before full resolution.  She denies any eye symptoms.  She reports that she has been mostly hive free over the last several weeks with only scattered short lasting hives occurring.  She continues cetirizine  10 mg once a day at this time and is not taking Allegra or famotidine .  She has not needed to use triamcinolone  on any red or itchy areas at this time.  She continues to follow-up with Dr. Jeannetta, rheumatology specialist.  Her current medications are listed in the chart.  Drug Allergies:  Allergies  Allergen Reactions   Fluoxetine Nausea Only and Nausea And Vomiting   Fluoxetine Hcl     Physical Exam: BP 110/80   Pulse (!) 114   Temp 98.2 F (36.8 C)   Wt 220 lb 8 oz (100 kg)   SpO2 97%   BMI 33.77 kg/m    Physical Exam Vitals reviewed.  Constitutional:      Appearance: Normal appearance.  HENT:     Head: Normocephalic and atraumatic.     Right Ear: Tympanic membrane normal.     Left Ear: Tympanic membrane normal.     Nose:     Comments: Bilateral naris normal.  Pharynx  normal.  Ears normal.  Eyes normal.    Mouth/Throat:     Pharynx: Oropharynx is clear.  Eyes:     Conjunctiva/sclera: Conjunctivae normal.  Cardiovascular:     Rate and Rhythm: Normal rate and regular rhythm.     Heart sounds: Normal heart sounds. No murmur heard. Pulmonary:     Effort: Pulmonary effort is normal.     Breath sounds: Normal breath sounds.     Comments: Lungs clear to auscultation Musculoskeletal:        General: Normal range of motion.     Cervical back: Normal range of motion and neck supple.  Skin:    General: Skin is warm and dry.  Neurological:     Mental Status: She is alert and oriented to person, place, and time.  Psychiatric:        Mood and Affect: Mood normal.        Behavior: Behavior normal.        Thought Content: Thought content normal.        Judgment: Judgment normal.     Assessment and Plan: 1. Chronic urticaria     Patient Instructions  Chronic urticaria Continue Dupixent  injections 300 mg once every 2 weeks for control of hives Take the least amount of medications while remaining hive free Cetirizine  (Zyrtec ) 10mg  twice a day and famotidine  (Pepcid )  20 mg twice a day. If no symptoms for 7-14 days then decrease to. Cetirizine  (Zyrtec ) 10mg  twice a day and famotidine  (Pepcid ) 20 mg once a day.  If no symptoms for 7-14 days then decrease to. Cetirizine  (Zyrtec ) 10mg  twice a day.  If no symptoms for 7-14 days then decrease to. Cetirizine  (Zyrtec ) 10mg  once a day.  May use Allegra as needed for breakthrough hives       If symptoms return, then step up dosage Continue triamcinolone  to red or itchy areas underneath your face up to twice a day if needed.  Do not use this medication longer than 2 weeks in a row Keep a detailed symptom journal including foods eaten, contact with allergens, medications taken, weather changes.  URI Keep notes of joint pain or any changes to your eyes.  Call the clinic if this treatment plan is not working well for  you.  Follow up in 6 months or sooner if needed.  Return in about 6 months (around 07/03/2024), or if symptoms worsen or fail to improve.    Thank you for the opportunity to care for this patient.  Please do not hesitate to contact me with questions.  Norma Mutter, FNP [facility] 

## 2024-01-02 ENCOUNTER — Encounter: Payer: Self-pay | Admitting: Family Medicine

## 2024-02-26 ENCOUNTER — Telehealth: Payer: Self-pay | Admitting: *Deleted

## 2024-02-26 NOTE — Telephone Encounter (Signed)
 Called patient per MD will be submitting to [employer] for [employer] for his CSU, they will get patient started while working through the approval process

## 2024-07-10 ENCOUNTER — Ambulatory Visit: Admitting: Allergy & Immunology
